# Patient Record
Sex: Male | Born: 1955
Health system: Southern US, Community
[De-identification: ages and names within clinical notes are randomized; demographics above are authoritative.]

## PROBLEM LIST (undated history)

## (undated) DIAGNOSIS — I509 Heart failure, unspecified: Secondary | ICD-10-CM

## (undated) DIAGNOSIS — I517 Cardiomegaly: Secondary | ICD-10-CM

## (undated) DIAGNOSIS — I10 Essential (primary) hypertension: Secondary | ICD-10-CM

## (undated) DIAGNOSIS — I5022 Chronic systolic (congestive) heart failure: Principal | ICD-10-CM

## (undated) DIAGNOSIS — N189 Chronic kidney disease, unspecified: Secondary | ICD-10-CM

## (undated) HISTORY — DX: Chronic systolic (congestive) heart failure: I50.22

## (undated) HISTORY — DX: Chronic kidney disease, unspecified: N18.9

## (undated) HISTORY — DX: Cardiomegaly: I51.7

## (undated) HISTORY — DX: Heart failure, unspecified: I50.9

## (undated) HISTORY — DX: Essential (primary) hypertension: I10

---

## 1999-09-16 HISTORY — PX: OTHER SURGICAL HISTORY: SHX169

## 1999-10-06 ENCOUNTER — Encounter: Payer: Self-pay | Admitting: Neurosurgery

## 1999-10-07 ENCOUNTER — Ambulatory Visit (HOSPITAL_COMMUNITY): Admission: RE | Admit: 1999-10-07 | Discharge: 1999-10-08 | Payer: Self-pay | Admitting: Neurosurgery

## 1999-10-07 ENCOUNTER — Encounter: Payer: Self-pay | Admitting: Neurosurgery

## 2017-12-14 DIAGNOSIS — I517 Cardiomegaly: Secondary | ICD-10-CM

## 2017-12-14 HISTORY — DX: Cardiomegaly: I51.7

## 2018-01-01 ENCOUNTER — Telehealth: Payer: Self-pay

## 2018-01-01 NOTE — Telephone Encounter (Signed)
Ok to take as new pt, lease schedule a OV at his convenience

## 2018-01-01 NOTE — Telephone Encounter (Signed)
Copied from CRM [id number]. Topic: General - Other >> Jan 01, 2018  4:35 PM Stephannie Li, NT wrote: Reason for CRM: Patient called to find out if Dr Drue Novel would take him as a patient , his cousin is a patient is and his name is Ileana Roup  please consider this request , please call him at [phone]

## 2018-01-02 NOTE — Telephone Encounter (Signed)
Patient has been scheduled for appointment on 01/11/18

## 2018-01-02 NOTE — Telephone Encounter (Signed)
Tried calling pt back but his number was not working. I will see if I can try his email.

## 2018-01-02 NOTE — Telephone Encounter (Signed)
Please call patient and schedule an appointment as new patient at your convenience.

## 2018-01-02 NOTE — Telephone Encounter (Signed)
Email address not listed.

## 2018-01-08 ENCOUNTER — Ambulatory Visit: Payer: Self-pay

## 2018-01-08 ENCOUNTER — Emergency Department (HOSPITAL_COMMUNITY)
Admission: EM | Admit: 2018-01-08 | Discharge: 2018-01-09 | Disposition: A | Discharge status: Home / Self Care | Payer: BLUE CROSS/BLUE SHIELD | Attending: Emergency Medicine | Admitting: Emergency Medicine

## 2018-01-08 ENCOUNTER — Other Ambulatory Visit: Payer: Self-pay

## 2018-01-08 ENCOUNTER — Emergency Department (HOSPITAL_COMMUNITY): Payer: BLUE CROSS/BLUE SHIELD

## 2018-01-08 ENCOUNTER — Encounter (HOSPITAL_COMMUNITY): Payer: Self-pay

## 2018-01-08 DIAGNOSIS — I159 Secondary hypertension, unspecified: Secondary | ICD-10-CM | POA: Diagnosis not present

## 2018-01-08 DIAGNOSIS — I509 Heart failure, unspecified: Secondary | ICD-10-CM | POA: Diagnosis not present

## 2018-01-08 DIAGNOSIS — R0600 Dyspnea, unspecified: Secondary | ICD-10-CM | POA: Diagnosis not present

## 2018-01-08 DIAGNOSIS — I110 Hypertensive heart disease with heart failure: Secondary | ICD-10-CM | POA: Diagnosis not present

## 2018-01-08 DIAGNOSIS — R0602 Shortness of breath: Secondary | ICD-10-CM | POA: Diagnosis not present

## 2018-01-08 DIAGNOSIS — N289 Disorder of kidney and ureter, unspecified: Secondary | ICD-10-CM | POA: Insufficient documentation

## 2018-01-08 LAB — HEPATIC FUNCTION PANEL
ALK PHOS: 106 U/L (ref 38–126)
ALT: 129 U/L — ABNORMAL HIGH (ref 17–63)
AST: 88 U/L — AB (ref 15–41)
Albumin: 3.7 g/dL (ref 3.5–5.0)
BILIRUBIN DIRECT: 0.4 mg/dL (ref 0.1–0.5)
BILIRUBIN TOTAL: 1.7 mg/dL — AB (ref 0.3–1.2)
Indirect Bilirubin: 1.3 mg/dL — ABNORMAL HIGH (ref 0.3–0.9)
Total Protein: 5.9 g/dL — ABNORMAL LOW (ref 6.5–8.1)

## 2018-01-08 LAB — CBC
HCT: 46.4 % (ref 39.0–52.0)
Hemoglobin: 15.4 g/dL (ref 13.0–17.0)
MCH: 30.1 pg (ref 26.0–34.0)
MCHC: 33.2 g/dL (ref 30.0–36.0)
MCV: 90.8 fL (ref 78.0–100.0)
Platelets: 288 10*3/uL (ref 150–400)
RBC: 5.11 MIL/uL (ref 4.22–5.81)
RDW: 13.3 % (ref 11.5–15.5)
WBC: 5.6 10*3/uL (ref 4.0–10.5)

## 2018-01-08 LAB — BASIC METABOLIC PANEL
Anion gap: 8 (ref 5–15)
BUN: 27 mg/dL — AB (ref 6–20)
CO2: 25 mmol/L (ref 22–32)
Calcium: 8.5 mg/dL — ABNORMAL LOW (ref 8.9–10.3)
Chloride: 109 mmol/L (ref 101–111)
Creatinine, Ser: 1.62 mg/dL — ABNORMAL HIGH (ref 0.61–1.24)
GFR calc Af Amer: 52 mL/min — ABNORMAL LOW (ref >60)
GFR, EST NON AFRICAN AMERICAN: 45 mL/min — AB (ref >60)
GLUCOSE: 111 mg/dL — AB (ref 65–99)
Potassium: 4.5 mmol/L (ref 3.5–5.1)
Sodium: 142 mmol/L (ref 135–145)

## 2018-01-08 LAB — I-STAT TROPONIN, ED: TROPONIN I, POC: 0.04 ng/mL (ref 0.00–0.08)

## 2018-01-08 LAB — BRAIN NATRIURETIC PEPTIDE: B Natriuretic Peptide: 809.9 pg/mL — ABNORMAL HIGH (ref 0.0–100.0)

## 2018-01-08 MED ORDER — FUROSEMIDE 40 MG PO TABS
40.0000 mg | ORAL_TABLET | Freq: Once | ORAL | Status: AC
  Administered 2018-01-09: 40 mg via ORAL
  Filled 2018-01-08: qty 1

## 2018-01-08 MED ORDER — AMLODIPINE BESYLATE 5 MG PO TABS: 5.0000 mg | ORAL_TABLET | Freq: Every day | ORAL | 2 refills | Status: DC

## 2018-01-08 MED ORDER — HYDROCHLOROTHIAZIDE 25 MG PO TABS: 25.0000 mg | ORAL_TABLET | Freq: Every day | ORAL | 2 refills | Status: DC

## 2018-01-08 MED ORDER — AMLODIPINE BESYLATE 5 MG PO TABS
10.0000 mg | ORAL_TABLET | Freq: Every day | ORAL | Status: DC
  Administered 2018-01-09: 10 mg via ORAL
  Filled 2018-01-08: qty 2

## 2018-01-08 NOTE — Telephone Encounter (Signed)
Was referred to the ED, he said he will go thus will see him 01/11/2009 as schedule

## 2018-01-08 NOTE — Telephone Encounter (Signed)
FYI. Pt has NP appt 01/11/2018.

## 2018-01-08 NOTE — ED Provider Notes (Signed)
[facility]-EMERGENCY DEPT Provider Note   CSN: [id number] Arrival date & time: 01/08/18  2052     History   Chief Complaint Chief Complaint  Patient presents with  . Shortness of Breath    HPI Brett Owen is a 62 y.o. male.  Patient complains of gradual onset of leg swelling, with weight gain, over the last 2 months, progressively worse, now associated with orthopnea and dyspnea on exertion.  He schedule appointment to see a new doctor in 3 days, but decided come today because he was getting worse.  He denies chest pain, nausea, vomiting, headache, back pain, weakness or dizziness.  No prior history of hypertension.  There are no other known modifying factors.  HPI  History reviewed. No pertinent past medical history.  There are no active problems to display for this patient.   Past Surgical History:  Procedure Laterality Date  . herniated disc surgery  09/1999   lumbar        Home Medications    Prior to Admission medications   Medication Sig Start Date End Date Taking? Authorizing Provider  Multiple Vitamin (MULTIVITAMIN WITH MINERALS) TABS tablet Take 1 tablet by mouth daily.   Yes [provider]  amLODipine (NORVASC) 5 MG tablet Take 1 tablet (5 mg total) by mouth daily. 01/08/18   Mancel Bale, MD  hydrochlorothiazide (HYDRODIURIL) 25 MG tablet Take 1 tablet (25 mg total) by mouth daily. 01/08/18   Mancel Bale, MD    Family History History reviewed. No pertinent family history.  Social History Social History   Tobacco Use  . Smoking status: Never Smoker  . Smokeless tobacco: Never Used  Substance Use Topics  . Alcohol use: Not Currently    Comment: seasonal, maybe at holidays  . Drug use: Never     Allergies   Patient has no known allergies.   Review of Systems Review of Systems  All other systems reviewed and are negative.    Physical Exam Updated Vital Signs BP (!) 157/118   Pulse (!) 113   Temp 98.4  F (36.9 C) (Oral)   Resp 17   Ht 6\' 2"  (1.88 m)   Wt 107.7 kg (237 lb 6.4 oz)   SpO2 98%   BMI 30.48 kg/m   Physical Exam  Constitutional: He is oriented to person, place, and time. He appears well-developed and well-nourished.  HENT:  Head: Normocephalic and atraumatic.  Right Ear: External ear normal.  Left Ear: External ear normal.  Eyes: Pupils are equal, round, and reactive to light. Conjunctivae and EOM are normal.  Neck: Normal range of motion and phonation normal. Neck supple. JVD present.  Cardiovascular: Regular rhythm and normal heart sounds.  Tachycardic and hypertensive  Pulmonary/Chest: Effort normal. He has decreased breath sounds in the right middle field, the right lower field, the left middle field and the left lower field. He has no wheezes. He has no rhonchi. He has no rales. He exhibits no bony tenderness.  Abdominal: Soft. There is no tenderness.  Musculoskeletal: Normal range of motion.  Neurological: He is alert and oriented to person, place, and time. No cranial nerve deficit or sensory deficit. He exhibits normal muscle tone. Coordination normal.  Skin: Skin is warm, dry and intact.  Psychiatric: He has a normal mood and affect. His behavior is normal. Judgment and thought content normal.  Nursing note and vitals reviewed.    ED Treatments / Results  Labs (all labs ordered are listed, but only abnormal  results are displayed) Labs Reviewed  BASIC METABOLIC PANEL - Abnormal; Notable for the following components:      Result Value   Glucose, Bld 111 (*)    BUN 27 (*)    Creatinine, Ser 1.62 (*)    Calcium 8.5 (*)    GFR calc non Af Amer 45 (*)    GFR calc Af Amer 52 (*)    All other components within normal limits  BRAIN NATRIURETIC PEPTIDE - Abnormal; Notable for the following components:   B Natriuretic Peptide 809.9 (*)    All other components within normal limits  HEPATIC FUNCTION PANEL - Abnormal; Notable for the following components:   Total  Protein 5.9 (*)    AST 88 (*)    ALT 129 (*)    Total Bilirubin 1.7 (*)    Indirect Bilirubin 1.3 (*)    All other components within normal limits  CBC  I-STAT TROPONIN, ED    EKG EKG Interpretation  Date/Time:  Tuesday January 08 2018 21:21:59 EDT Ventricular Rate:  118 PR Interval:    QRS Duration: 85 QT Interval:  410 QTC Calculation: 575 R Axis:   69 Text Interpretation:  Sinus tachycardia Probable left atrial enlargement Left ventricular hypertrophy Borderline T abnormalities, inferior leads Prolonged QT interval No old tracing to compare Confirmed by Mancel Bale [phone]) on 01/08/2018 9:32:07 PM   Radiology Dg Chest 2 View  Result Date: 01/08/2018 CLINICAL DATA:  Shortness of breath. Bilateral lower extremity swelling. EXAM: CHEST - 2 VIEW COMPARISON:  None. FINDINGS: The cardiac silhouette is mildly enlarged. There is elevation of the right hemidiaphragm. A small right pleural effusion is present. There may be a trace left pleural effusion. There is mild pulmonary vascular congestion without overt edema. No pneumothorax is identified. No acute osseous abnormality is seen. IMPRESSION: 1. Cardiomegaly and pulmonary vascular congestion. 2. Small right and possible trace left pleural effusions. Electronically Signed   By: Sebastian Ache M.D.   On: 01/08/2018 21:55    Procedures Procedures (including critical care time)  Medications Ordered in ED Medications  furosemide (LASIX) tablet 40 mg (has no administration in time range)  amLODipine (NORVASC) tablet 10 mg (has no administration in time range)     Initial Impression / Assessment and Plan / ED Course  I have reviewed the triage vital signs and the nursing notes.  Pertinent labs & imaging results that were available during my care of the patient were reviewed by me and considered in my medical decision making (see chart for details).  Clinical Course as of Jan 08 2357  Tue Jan 08, 2018  2155 Patient with shortness of  breath, JVD, tachycardia and hypertension, most likely has insidious onset of hypertension causing congestive heart failure.  Screening labs obtained and chest x-ray ordered   [EW]  2252 I-stat troponin, ED [EW]  2253 Normal  I-stat troponin, ED [EW]  2253 Mild transaminase elevation  Hepatic function panel(!) [EW]  2253 Elevated BUN, creatinine.  Low calcium and GFR.  Basic metabolic panel(!) [EW]  2253 Normal  CBC [EW]  2253 Stress test that was fluid overload, vascular congestion and small pleural effusions.  DG Chest 2 View [EW]  2254 Still hypertensive, but improved from admission.  BP(!): 154/108 [EW]  2254 Lasix ordered for fluid overload   [EW]    Clinical Course User Index [EW] Mancel Bale, MD     Patient Vitals for the past 24 hrs:  BP Temp Temp src Pulse Resp SpO2  Height Weight  01/08/18 2302 (!) 157/118 - - (!) 113 17 98 % - -  01/08/18 2228 (!) 154/108 - - (!) 110 17 97 % - -  01/08/18 2124 - - - - - 97 % - -  01/08/18 2109 (!) 160/120 98.4 F (36.9 C) Oral (!) 117 16 100 % 6\' 2"  (1.88 m) 107.7 kg (237 lb 6.4 oz)    11:49 PM Reevaluation with update and discussion. After initial assessment and treatment, an updated evaluation reveals he remains comfortable has no further complaints.  Findings discussed with patient all questions answered.  He is agreeable to discharge home and following up with his PCP coming 3 days. Mancel Bale      Final Clinical Impressions(s) / ED Diagnoses   Final diagnoses:  Secondary hypertension  Acute congestive heart failure, unspecified heart failure type [facility])  Renal insufficiency    ED Discharge Orders        Ordered    amLODipine (NORVASC) 5 MG tablet  Daily     01/08/18 2351    hydrochlorothiazide (HYDRODIURIL) 25 MG tablet  Daily     01/08/18 2351       Mancel Bale, MD 01/08/18 2358

## 2018-01-08 NOTE — ED Triage Notes (Addendum)
Pt c/o shortness of breath with exacerbation, bilateral swelling in ankles, weight gain that has been progressively worse over the past 6 weeks. Pt reports while lying flat, it feels like there is "fluid in lungs" Abdomen feels "full" and is distended. No previous cardiac problems. Called new PCP, was scheduled for an apt this Friday, but called today and the PCP encouraged pt to come to the ED. Denies chest pain, but does experience palpitations from "time to time." Also has a dry cough x1 week, worse after going from resting to exertion. Speaking in full sentences.

## 2018-01-08 NOTE — Telephone Encounter (Signed)
Pt. Reports has noticed wt. Gain and swelling to lower extremities x 2 weeks, but has gotten worse this week. Short of breath with exertion and laying down at night - has to prop up on pillows. Also has tightness in his abdomen "which is very uncomfortable". Denies any chest pain. No availability in office. Recommended that pt. Go to ED for evaluation today - FC agrees. Pt. Verbalizes understanding. Reason for Disposition . [1] Difficulty breathing with exertion (e.g., walking) AND [2] new onset or worsening  Answer Assessment - Initial Assessment Questions 1. ONSET: "When did the swelling start?" (e.g., minutes, hours, days)     2 weeks ago 2. LOCATION: "What part of the leg is swollen?"  "Are both legs swollen or just one leg?"     From feet to his thighs 3. SEVERITY: "How bad is the swelling?" (e.g., localized; mild, moderate, severe)  - Localized - small area of swelling localized to one leg  - MILD pedal edema - swelling limited to foot and ankle, pitting edema < 1/4 inch (6 mm) deep, rest and elevation eliminate most or all swelling  - MODERATE edema - swelling of lower leg to knee, pitting edema > 1/4 inch (6 mm) deep, rest and elevation only partially reduce swelling  - SEVERE edema - swelling extends above knee, facial or hand swelling present      Severe 4. REDNESS: "Does the swelling look red or infected?"     No 5. PAIN: "Is the swelling painful to touch?" If so, ask: "How painful is it?"   (Scale 1-10; mild, moderate or severe)     2 6. FEVER: "Do you have a fever?" If so, ask: "What is it, how was it measured, and when did it start?"      No 7. CAUSE: "What do you think is causing the leg swelling?"     Unsure 8. MEDICAL HISTORY: "Do you have a history of heart failure, kidney disease, liver failure, or cancer?"     No 9. RECURRENT SYMPTOM: "Have you had leg swelling before?" If so, ask: "When was the last time?" "What happened that time?"     Never 10. OTHER SYMPTOMS: "Do  you have any other symptoms?" (e.g., chest pain, difficulty breathing)       At night has to prop on pillows and with minima exertion 11. PREGNANCY: "Is there any chance you are pregnant?" "When was your last menstrual period?"       No  Protocols used: LEG SWELLING AND EDEMA-A-AH

## 2018-01-08 NOTE — Discharge Instructions (Addendum)
Your primary problem at this time appears to be high blood pressure.  We are treating it two ways to lower the blood pressure.  The high blood pressure has led to fluid overload and decreased kidney perfusion raising your creatinine.  Hopefully the medicines will help resolve these problems.  It is important to follow-up with your doctor on Friday as scheduled.  Ask him about getting a cardiac echo to further evaluate your heart and high blood pressure.  If your symptoms worsen please return here for a checkup.  Is important to avoid added salt in your diet to help improve your blood pressure.  Please review the attached information regarding the DASH diet

## 2018-01-09 ENCOUNTER — Encounter: Payer: Self-pay | Admitting: General Practice

## 2018-01-09 DIAGNOSIS — I10 Essential (primary) hypertension: Secondary | ICD-10-CM | POA: Insufficient documentation

## 2018-01-09 DIAGNOSIS — N182 Chronic kidney disease, stage 2 (mild): Secondary | ICD-10-CM | POA: Insufficient documentation

## 2018-01-09 DIAGNOSIS — I509 Heart failure, unspecified: Secondary | ICD-10-CM | POA: Insufficient documentation

## 2018-01-11 ENCOUNTER — Ambulatory Visit: Payer: BLUE CROSS/BLUE SHIELD | Admitting: Internal Medicine

## 2018-01-11 ENCOUNTER — Telehealth: Payer: Self-pay

## 2018-01-11 ENCOUNTER — Encounter: Payer: Self-pay | Admitting: Internal Medicine

## 2018-01-11 VITALS — BP 132/78 | HR 114 | Temp 97.8°F | Resp 14 | Ht 74.0 in | Wt 237.0 lb

## 2018-01-11 DIAGNOSIS — I509 Heart failure, unspecified: Secondary | ICD-10-CM

## 2018-01-11 DIAGNOSIS — I10 Essential (primary) hypertension: Secondary | ICD-10-CM

## 2018-01-11 LAB — COMPREHENSIVE METABOLIC PANEL
AG Ratio: 1.9 (calc) (ref 1.0–2.5)
ALKALINE PHOSPHATASE (APISO): 108 U/L (ref 40–115)
ALT: 88 U/L — AB (ref 9–46)
AST: 39 U/L — AB (ref 10–35)
Albumin: 3.9 g/dL (ref 3.6–5.1)
BUN/Creatinine Ratio: 15 (calc) (ref 6–22)
BUN: 25 mg/dL (ref 7–25)
CALCIUM: 9.2 mg/dL (ref 8.6–10.3)
CO2: 28 mmol/L (ref 20–32)
CREATININE: 1.63 mg/dL — AB (ref 0.70–1.25)
Chloride: 105 mmol/L (ref 98–110)
Globulin: 2.1 g/dL (calc) (ref 1.9–3.7)
Glucose, Bld: 75 mg/dL (ref 65–99)
Potassium: 4.9 mmol/L (ref 3.5–5.3)
Sodium: 141 mmol/L (ref 135–146)
Total Bilirubin: 1.3 mg/dL — ABNORMAL HIGH (ref 0.2–1.2)
Total Protein: 6 g/dL — ABNORMAL LOW (ref 6.1–8.1)

## 2018-01-11 LAB — TSH: TSH: 3.18 m[IU]/L (ref 0.40–4.50)

## 2018-01-11 MED ORDER — CARVEDILOL 3.125 MG PO TABS: 3.1250 mg | ORAL_TABLET | Freq: Two times a day (BID) | ORAL | 0 refills | Status: DC

## 2018-01-11 MED ORDER — FUROSEMIDE 20 MG PO TABS: 20.0000 mg | ORAL_TABLET | Freq: Every day | ORAL | 0 refills | Status: DC

## 2018-01-11 NOTE — Progress Notes (Signed)
Subjective:    Patient ID: Brett Owen, male    DOB: 11-Oct-1956, 62 y.o.   MRN: [id number]  DOS:  01/11/2018 Type of visit - description : New patient Interval history: He has a number of symptoms. Started mid January 2019 he noticed gradual weight gain. Started February 2019 he noted gradual increase in lower extremity edema. He also has classic orthopnea, when he lays down he developed cough and a feeling of choked. He also has abdominal pressure and is able to only eat small portions. + DOE, occasionally shortness of breath at rest.  With above symptoms, he was scheduled visit to this office as a new patient however I recommended ER suspecting CHF.  Patient went to the ER 01/08/2018: BP was 157 10/16/2016, 155/121. Labs: Creatinine 1.6, potassium 4.5, CBC normal, BNP 809.9 which is high. LFTs: Elevated.  Troponin negative. Chest x-ray show cardiomegaly and pulmonary vascular congestion, small right and possible left trace left pleural effusions.  At the ER he was prescribed HCTZ and amlodipine and was sent here.  Wt Readings from Last 3 Encounters:  01/11/18 237 lb (107.5 kg)  01/08/18 237 lb 6.4 oz (107.7 kg)    Review of Systems  Since he left the ER he is actually feeling better.  Has lost 1 or 2 pounds per his own scales. Orthopnea has decreased, abdominal pressure is also decreased.  Denies fever chills Denies taking any herbal medications. No nausea, vomiting, diarrhea No recent airplane trip or prolonged car trip. Occasional cough mostly with walking Denies chest pain occasional palpitations.  Past Medical History:  Diagnosis Date  . Cardiomegaly 12/2017   seen on chest x-ray  . CHF (congestive heart failure) (HCC)   . Hypertension   . Renal insufficiency     Past Surgical History:  Procedure Laterality Date  . herniated disc surgery  09/1999   lumbar    Social History   Socioeconomic History  . Marital status: Single    Spouse name: Not on file    . Number of children: 0  . Years of education: Not on file  . Highest education level: Not on file  Occupational History  . Occupation: Office manager, semi -retired   Engineer, production  . Financial resource strain: Not on file  . Food insecurity:    Worry: Not on file    Inability: Not on file  . Transportation needs:    Medical: Not on file    Non-medical: Not on file  Tobacco Use  . Smoking status: Never Smoker  . Smokeless tobacco: Never Used  Substance and Sexual Activity  . Alcohol use: Not Currently    Comment: seasonal, maybe at holidays  . Drug use: Never  . Sexual activity: Not on file  Lifestyle  . Physical activity:    Days per week: Not on file    Minutes per session: Not on file  . Stress: Not on file  Relationships  . Social connections:    Talks on phone: Not on file    Gets together: Not on file    Attends religious service: Not on file    Active member of club or organization: Not on file    Attends meetings of clubs or organizations: Not on file    Relationship status: Not on file  . Intimate partner violence:    Fear of current or ex partner: Not on file    Emotionally abused: Not on file    Physically abused: Not on file  Forced sexual activity: Not on file  Other Topics Concern  . Not on file  Social History Narrative   Lives by himself     Family History  Problem Relation Age of Onset  . Colon cancer Mother 60  . Alcohol abuse Father   . Hypertension Paternal Grandfather   . CAD Neg Hx   . Prostate cancer Neg Hx      Allergies as of 01/11/2018   No Known Allergies     Medication List        Accurate as of 01/11/18  2:39 PM. Always use your most recent med list.          amLODipine 5 MG tablet Commonly known as:  NORVASC Take 1 tablet (5 mg total) by mouth daily.   hydrochlorothiazide 25 MG tablet Commonly known as:  HYDRODIURIL Take 1 tablet (25 mg total) by mouth daily.   multivitamin with minerals Tabs tablet Take 1 tablet by  mouth daily.          Objective:   Physical Exam BP 132/78 (BP Location: Left Arm, Patient Position: Sitting, Cuff Size: Normal)   Pulse (!) 114   Temp 97.8 F (36.6 C) (Oral)   Resp 14   Ht 6\' 2"  (1.88 m)   Wt 237 lb (107.5 kg)   SpO2 97%   BMI 30.43 kg/m  General:   Well developed, well nourished . NAD.  Neck: No  thyromegaly.  Normal carotid pulses, JVD to jaw HEENT:  Normocephalic . Face symmetric, atraumatic Lungs:  CTA B Normal respiratory effort, no intercostal retractions, no accessory muscle use. Heart: RRR,  no murmur.  +/+++ pretibial edema bilaterally  He was comfortable when laying down at the table for EKG Abdomen:  Not distended, soft, non-tender. No rebound or rigidity.  No organomegaly to palpation or percussion Skin: Exposed areas without rash. Not pale. Not jaundice Neurologic:  alert & oriented X3.  Speech normal, gait appropriate for age and unassisted Strength symmetric and appropriate for age.  Psych: Cognition and judgment appear intact.  Cooperative with normal attention span and concentration.  Behavior appropriate. No anxious or depressed appearing.     Assessment & Plan:   Assessment HTN new dx 12-2017 CHF new dx  12-2017  Plan: CHF, hypertension. Patient was not aware of any illness or disease until his recent visit to the ER. He is having classic symptoms of CHF since mid January. His physical exam, x-rays and labs are consistent with that. He started HCTZ and amlodipine as Rx by the ER physician and is actually feeling better  EKG today: Sinus tachycardia, LVH, no acute. Case discussed with Dr. Ladona Ridgel, I recommended to switch to Lasix, start a low-dose dose of carvedilol and see cardiology next week.  He agreed with my recommendations. Also will start on low-dose aspirin and check a CMP. If he has severe electrolyte abnormalities will recommend ER during the weekend.  Patient aware. See AVS Pt was very surprised by the dx, he is  counseled  RTC here 1 month see cardiology next week    Today, I spent more than 45   min with the patient: >50% of the time counseling regards near diagnosis of CHF, he was surprised, I counseled him, explaining that he needs further evaluation by cardiology. Also: -reviewing the chart and labs ordered by other providers  -coordinating his care

## 2018-01-11 NOTE — Patient Instructions (Addendum)
GO TO THE LAB : Get the blood work     GO TO THE FRONT DESK Schedule your next appointment for a checkup in 4 weeks  Stop hydrochlorothiazide Start carvedilol Start Lasix Continue amlodipine Start aspirin 81 mg daily   ER if: Increasing symptoms, chest pain, significant weight gain.  Weigh yourself daily  You will see cardiology next week     Heart Failure Heart failure means your heart has trouble pumping blood. This makes it hard for your body to work well. Heart failure is usually a long-term (chronic) condition. You must take good care of yourself and follow your doctor's treatment plan. Follow these instructions at home:  Take your heart medicine as told by your doctor. ? Do not stop taking medicine unless your doctor tells you to. ? Do not skip any dose of medicine. ? Refill your medicines before they run out. ? Take other medicines only as told by your doctor or pharmacist.  Stay active if told by your doctor. The elderly and people with severe heart failure should talk with a doctor about physical activity.  Eat heart-healthy foods. Choose foods that are without trans fat and are low in saturated fat, cholesterol, and salt (sodium). This includes fresh or frozen fruits and vegetables, fish, lean meats, fat-free or low-fat dairy foods, whole grains, and high-fiber foods. Lentils and dried peas and beans (legumes) are also good choices.  Limit salt if told by your doctor.  Cook in a healthy way. Roast, grill, broil, bake, poach, steam, or stir-fry foods.  Limit fluids as told by your doctor.  Weigh yourself every morning. Do this after you pee (urinate) and before you eat breakfast. Write down your weight to give to your doctor.  Take your blood pressure and write it down if your doctor tells you to.  Ask your doctor how to check your pulse. Check your pulse as told.  Lose weight if told by your doctor.  Stop smoking or chewing tobacco. Do not use gum or  patches that help you quit without your doctor's approval.  Schedule and go to doctor visits as told.  Nonpregnant women should have no more than 1 drink a day. Men should have no more than 2 drinks a day. Talk to your doctor about drinking alcohol.  Stop illegal drug use.  Stay current with shots (immunizations).  Manage your health conditions as told by your doctor.  Learn to manage your stress.  Rest when you are tired.  If it is really hot outside: ? Avoid intense activities. ? Use air conditioning or fans, or get in a cooler place. ? Avoid caffeine and alcohol. ? Wear loose-fitting, lightweight, and light-colored clothing.  If it is really cold outside: ? Avoid intense activities. ? Layer your clothing. ? Wear mittens or gloves, a hat, and a scarf when going outside. ? Avoid alcohol.  Learn about heart failure and get support as needed.  Get help to maintain or improve your quality of life and your ability to care for yourself as needed. Contact a doctor if:  You gain weight quickly.  You are more short of breath than usual.  You cannot do your normal activities.  You tire easily.  You cough more than normal, especially with activity.  You have any or more puffiness (swelling) in areas such as your hands, feet, ankles, or belly (abdomen).  You cannot sleep because it is hard to breathe.  You feel like your heart is beating fast (palpitations).  You get dizzy or light-headed when you stand up. Get help right away if:  You have trouble breathing.  There is a change in mental status, such as becoming less alert or not being able to focus.  You have chest pain or discomfort.  You faint. This information is not intended to replace advice given to you by your health care provider. Make sure you discuss any questions you have with your health care provider. Document Released: 07/11/2008 Document Revised: 03/09/2016 Document Reviewed: 11/18/2012 Elsevier  Interactive Patient Education  2017 [employer].

## 2018-01-11 NOTE — Telephone Encounter (Signed)
DOD call received from Dr. Drue Novel with [facility] regarding new diagnosis of CHF per Dr. Drue Novel nurse. Per Dr. Lynn Ito to start carvedilol 3.125 mg po one tablet daily and furosemide 20 mg one tablet po daily. Pt needs ECHO and f/u with cards APP.   ECHO ordered.   Scheduling notified.

## 2018-01-11 NOTE — Progress Notes (Signed)
Pre visit review using our clinic review tool, if applicable. No additional management support is needed unless otherwise documented below in the visit note. 

## 2018-01-12 DIAGNOSIS — Z09 Encounter for follow-up examination after completed treatment for conditions other than malignant neoplasm: Secondary | ICD-10-CM | POA: Insufficient documentation

## 2018-01-12 NOTE — Assessment & Plan Note (Signed)
CHF, hypertension. Patient was not aware of any illness or disease until his recent visit to the ER. He is having classic symptoms of CHF since mid January. His physical exam, x-rays and labs are consistent with that. He started HCTZ and amlodipine as Rx by the ER physician and is actually feeling better  EKG today: Sinus tachycardia, LVH, no acute. Case discussed with Dr. Ladona Ridgel, I recommended to switch to Lasix, start a low-dose dose of carvedilol and see cardiology next week.  He agreed with my recommendations. Also will start on low-dose aspirin and check a CMP. If he has severe electrolyte abnormalities will recommend ER during the weekend.  Patient aware. See AVS Pt was very surprised by the dx, he is counseled  RTC here 1 month see cardiology next week

## 2018-01-14 ENCOUNTER — Encounter (HOSPITAL_COMMUNITY): Payer: Self-pay | Admitting: *Deleted

## 2018-01-14 ENCOUNTER — Other Ambulatory Visit: Payer: Self-pay

## 2018-01-14 ENCOUNTER — Ambulatory Visit (HOSPITAL_COMMUNITY): Payer: BLUE CROSS/BLUE SHIELD | Attending: Internal Medicine

## 2018-01-14 DIAGNOSIS — I509 Heart failure, unspecified: Secondary | ICD-10-CM | POA: Diagnosis not present

## 2018-01-14 DIAGNOSIS — I110 Hypertensive heart disease with heart failure: Secondary | ICD-10-CM | POA: Diagnosis not present

## 2018-01-14 DIAGNOSIS — I313 Pericardial effusion (noninflammatory): Secondary | ICD-10-CM | POA: Insufficient documentation

## 2018-01-14 DIAGNOSIS — I083 Combined rheumatic disorders of mitral, aortic and tricuspid valves: Secondary | ICD-10-CM | POA: Diagnosis not present

## 2018-01-14 NOTE — Progress Notes (Unsigned)
Brett Owen arrives complaining of shortness of breath and Edema for the last few months.  Has been seen in the ER and by Dr. Drue Novel recently and was diagnosed with HTN and Acute CHF.  Echocardiogram shows EF at 30% which seems generous visibly, with moderate to severe Regurgitation of Tricuspid, Mitral and Aortic Valves.  These results were taken to DOD Dr. [place] Emms, who releases the patient to his appointment with Dr. Elease Hashimoto at 8:40 am tomorrow morning.  Dr. Elease Hashimoto has also been made aware of the echo findings.    Farrel Conners, RDCS

## 2018-01-15 ENCOUNTER — Ambulatory Visit: Payer: BLUE CROSS/BLUE SHIELD | Admitting: Cardiovascular Disease

## 2018-01-15 ENCOUNTER — Encounter: Payer: Self-pay | Admitting: Cardiovascular Disease

## 2018-01-15 VITALS — BP 130/96 | HR 112 | Ht 74.0 in | Wt 237.8 lb

## 2018-01-15 DIAGNOSIS — I509 Heart failure, unspecified: Secondary | ICD-10-CM | POA: Diagnosis not present

## 2018-01-15 DIAGNOSIS — I5043 Acute on chronic combined systolic (congestive) and diastolic (congestive) heart failure: Secondary | ICD-10-CM

## 2018-01-15 DIAGNOSIS — I5022 Chronic systolic (congestive) heart failure: Secondary | ICD-10-CM | POA: Insufficient documentation

## 2018-01-15 HISTORY — DX: Chronic systolic (congestive) heart failure: I50.22

## 2018-01-15 MED ORDER — FUROSEMIDE 40 MG PO TABS: 40.0000 mg | ORAL_TABLET | Freq: Every day | ORAL | 3 refills | Status: DC

## 2018-01-15 MED ORDER — SACUBITRIL-VALSARTAN 24-26 MG PO TABS: 1.0000 | ORAL_TABLET | Freq: Two times a day (BID) | ORAL | 3 refills | Status: DC

## 2018-01-15 NOTE — Progress Notes (Signed)
Cardiology Office Note:    Date:  01/15/2018   ID:  Brett Owen, DOB 11/14/1955, MRN [id number]  PCP:  Wanda Plump, MD  Cardiologist:  Kristeen Miss, MD   Referring MD: Wanda Plump, MD   Problem list 1.  Acute on chronic combined systolic /  Diastolic  congestive heart failure: 2.  Essential hypertension 3.  Renal insufficiency  Chief Complaint  Patient presents with  . Congestive Heart Failure    History of Present Illness:    Brett Owen is a 62 y.o. male with a hx of hypertension and chronic kidney disease.  He is referred to see me for worsening shortness of breath.  He had an echocardiogram yesterday which showed severe left ventricular dysfunction with an ejection fraction of 25-30%.  He has mild aortic insufficiency, moderate mitral regurgitation.  There was a small pericardial effusion.  9 days ago, started having weight gain 1-2 lbs a week.   Developed swelling in ankles.  Progressed to calves and knees and eventually in the abd. Was not able to eat full meals Has developed shortness of breath,   HR has been 105 - 115  Has developed a dry cough,  Cough is worse with exertion + PND and orthopnea ,  Sleeps in a relciner propped up  15 lb weight gain .  Has not gone to the doctor regularly  Went to the ER and then Dr. Drue Novel  In the [place] long emergency room for HTN he was started on amlodipine 5 mg a day and HCTZ 25 mg a day.  He is he saw Dr. Drue Novel  several days later.  The HCTZ was discontinued and he was started on Lasix.  Carvedilol 3.125 mg twice a day was started.   The abdominal pressure has resolved slightly .  Has stopped gaining weight. Still has PND and orthopnea  No chest pain, no dizziness, no blurry vision  Tries to limit his salt intake. Has had some CKD  - Creatinine is 1.6   Mother had HTN, maternal grandfather had HTN,   Retired Building control surveyor , now works security  Does not exercise regularly .     Past Medical History:  Diagnosis Date  .  Cardiomegaly 12/2017   seen on chest x-ray  . CHF (congestive heart failure) (HCC)   . Hypertension   . Renal insufficiency     Past Surgical History:  Procedure Laterality Date  . herniated disc surgery  09/1999   lumbar    Current Medications: Current Meds  Medication Sig  . amLODipine (NORVASC) 5 MG tablet Take 1 tablet (5 mg total) by mouth daily.  [name] Kitchen aspirin EC 81 MG tablet Take 81 mg by mouth daily.  . carvedilol (COREG) 3.125 MG tablet Take 1 tablet (3.125 mg total) by mouth 2 (two) times daily with a meal.  . furosemide (LASIX) 20 MG tablet Take 1 tablet (20 mg total) by mouth daily.  . Multiple Vitamin (MULTIVITAMIN WITH MINERALS) TABS tablet Take 1 tablet by mouth daily.     Allergies:   Patient has no known allergies.   Social History   Socioeconomic History  . Marital status: Single    Spouse name: Not on file  . Number of children: 0  . Years of education: Not on file  . Highest education level: Not on file  Occupational History  . Occupation: Office manager, semi -retired   Engineer, production  . Financial resource strain: Not on file  . Food  insecurity:    Worry: Not on file    Inability: Not on file  . Transportation needs:    Medical: Not on file    Non-medical: Not on file  Tobacco Use  . Smoking status: Never Smoker  . Smokeless tobacco: Never Used  Substance and Sexual Activity  . Alcohol use: Not Currently    Comment: seasonal, maybe at holidays  . Drug use: Never  . Sexual activity: Not on file  Lifestyle  . Physical activity:    Days per week: Not on file    Minutes per session: Not on file  . Stress: Not on file  Relationships  . Social connections:    Talks on phone: Not on file    Gets together: Not on file    Attends religious service: Not on file    Active member of club or organization: Not on file    Attends meetings of clubs or organizations: Not on file    Relationship status: Not on file  Other Topics Concern  . Not on file  Social  History Narrative   Lives by himself     Family History: The patient's family history includes Alcohol abuse in his father; Colon cancer (age of onset: 55) in his mother; Hypertension in his paternal grandfather. There is no history of CAD or Prostate cancer.  ROS:   Please see the history of present illness.     All other systems reviewed and are negative.  EKGs/Labs/Other Studies Reviewed:    The following studies were reviewed today: Office note from Dr. Drue Novel and ER visit   EKG:  January 11, 2018  The ekg ordered today demonstrates  Sinus tach at 105.  NS ST abn.   Recent Labs: 01/08/2018: B Natriuretic Peptide 809.9; Hemoglobin 15.4; Platelets 288 01/11/2018: ALT 88; BUN 25; Creat 1.63; Potassium 4.9; Sodium 141; TSH 3.18  Recent Lipid Panel No results found for: CHOL, TRIG, HDL, CHOLHDL, VLDL, LDLCALC, LDLDIRECT  Physical Exam:    VS:  BP (!) 130/96   Pulse (!) 112   Ht 6\' 2"  (1.88 m)   Wt 237 lb 12.8 oz (107.9 kg)   SpO2 98%   BMI 30.53 kg/m     Wt Readings from Last 3 Encounters:  01/15/18 237 lb 12.8 oz (107.9 kg)  01/11/18 237 lb (107.5 kg)  01/08/18 237 lb 6.4 oz (107.7 kg)     GEN:  Well nourished, well developed in no acute distress HEENT: Normal NECK: Darkly elevated JVD.  No carotid bruits LYMPHATICS: No lymphadenopathy CARDIAC: Regular rate S1-S2.  Positive S3 gallop RESPIRATORY:  Clear to auscultation without rales, wheezing or rhonchi  ABDOMEN: Soft, non-tender, non-distended MUSCULOSKELETAL: Pitting edema all the way up to the abdomen. SKIN: Warm and dry NEUROLOGIC:  Alert and oriented x 3 PSYCHIATRIC:  Normal affect   ASSESSMENT:    No diagnosis found. PLAN:    In order of problems listed above:  1. Chronic combined systolic and diastolic congestive heart failure: Brett Owen presents with acute on chronic systolic and diastolic congestive heart failure. He does not go to the doctor regularly.  He apparently has had high blood pressure for quite  some time.  He is been started on Lasix 20 mg a day, carvedilol 3.125 mg twice a day, aspirin 81 mg a day and amlodipine 5 mill grams a day.  Is feeling a little bit better but still has significant volume.   We will discontinue the Norvasc.  We will start him  on Entresto 24-26 mg twice a day.  Increase Lasix to 40 mg a day.  I will see him in 2 days at 2 PM. Check a basic metabolic profile at that time. Anticipate  seeing him again in 2 weeks after that.  Had a long discussion about reduction of salt intake. At some point will need to rule out ischemia.  Is not had any episodes of chest discomfort.  We will work through this after we have 2 new moderate bit.  He is not able to lie flat enough for heart catheterization at this point.  2.  Hypertension: Continue up titration of medications.  Medication Adjustments/Labs and Tests Ordered: Current medicines are reviewed at length with the patient today.  Concerns regarding medicines are outlined above.  No orders of the defined types were placed in this encounter.  No orders of the defined types were placed in this encounter.   Signed, Kristeen Miss, MD  01/15/2018 8:53 AM    [facility]

## 2018-01-15 NOTE — Patient Instructions (Signed)
Medication Instructions:  1) DISCONTINUE Amlodipine 2) INCREASE Furosemide to 40mg  once daily 3) START Entresto 24/26mg - one tablet twice daily  Labwork: BMET when you return on 4/4  Your physician recommends that you return for lab work in: 2 weeks (BMET)   Testing/Procedures: None  Follow-Up: Your physician recommends that you schedule a follow-up appointment in: 4-4 at 2pm with Dr. Elease Hashimoto (ok per Dr. Elease Hashimoto)    Any Other Special Instructions Will Be Listed Below (If Applicable).     If you need a refill on your cardiac medications before your next appointment, please call your pharmacy.

## 2018-01-16 ENCOUNTER — Telehealth: Payer: Self-pay

## 2018-01-16 NOTE — Telephone Encounter (Signed)
I have done an [place] PA through covermymeds. Key: [id number]

## 2018-01-17 ENCOUNTER — Encounter: Payer: Self-pay | Admitting: Cardiovascular Disease

## 2018-01-17 ENCOUNTER — Ambulatory Visit: Payer: BLUE CROSS/BLUE SHIELD | Admitting: Cardiovascular Disease

## 2018-01-17 VITALS — BP 112/84 | HR 102 | Ht 74.0 in | Wt 224.8 lb

## 2018-01-17 DIAGNOSIS — I5023 Acute on chronic systolic (congestive) heart failure: Secondary | ICD-10-CM

## 2018-01-17 MED ORDER — POTASSIUM CHLORIDE CRYS ER 20 MEQ PO TBCR: 20.0000 meq | EXTENDED_RELEASE_TABLET | Freq: Every day | ORAL | 3 refills | Status: DC

## 2018-01-17 MED ORDER — CARVEDILOL 6.25 MG PO TABS: 25.0000 mg | ORAL_TABLET | Freq: Two times a day (BID) | ORAL | 3 refills | Status: DC

## 2018-01-17 MED ORDER — TORSEMIDE 20 MG PO TABS: 20.0000 mg | ORAL_TABLET | Freq: Two times a day (BID) | ORAL | 3 refills | Status: DC

## 2018-01-17 NOTE — Progress Notes (Signed)
Cardiology Office Note:    Date:  01/17/2018   ID:  Brett Owen, DOB Dec 27, 1955, MRN [id number]  PCP:  Wanda Plump, MD  Cardiologist:  Kristeen Miss, MD   Referring MD: Wanda Plump, MD   Problem list 1.  Acute on chronic combined systolic /  Diastolic  congestive heart failure: 2.  Essential hypertension 3.  Renal insufficiency  Chief Complaint  Patient presents with  . Congestive Heart Failure    History of Present Illness:    Brett Owen is a 62 y.o. male with a hx of hypertension and chronic kidney disease.  He is referred to see me for worsening shortness of breath.  He had an echocardiogram yesterday which showed severe left ventricular dysfunction with an ejection fraction of 25-30%.  He has mild aortic insufficiency, moderate mitral regurgitation.  There was a small pericardial effusion.  9 days ago, started having weight gain 1-2 lbs a week.   Developed swelling in ankles.  Progressed to calves and knees and eventually in the abd. Was not able to eat full meals Has developed shortness of breath,   HR has been 105 - 115  Has developed a dry cough,  Cough is worse with exertion + PND and orthopnea ,  Sleeps in a relciner propped up  15 lb weight gain .  Has not gone to the doctor regularly  Went to the ER and then Dr. Drue Novel  In the [place] long emergency room for HTN he was started on amlodipine 5 mg a day and HCTZ 25 mg a day.  He is he saw Dr. Drue Novel  several days later.  The HCTZ was discontinued and he was started on Lasix.  Carvedilol 3.125 mg twice a day was started.   The abdominal pressure has resolved slightly .  Has stopped gaining weight. Still has PND and orthopnea  No chest pain, no dizziness, no blurry vision  Tries to limit his salt intake. Has had some CKD  - Creatinine is 1.6   Mother had HTN, maternal grandfather had HTN,   Retired Building control surveyor , now works security  Does not exercise regularly .   January 17, 2018:  Wt is 224 today  - was 237 2  days ago - down 13 lbs   Had a very delayed reaction to Lasix -   Took 7 hours for diuressi  - then he diuresed for 12 hours   Feeling better   Past Medical History:  Diagnosis Date  . Cardiomegaly 12/2017   seen on chest x-ray  . CHF (congestive heart failure) (HCC)   . Hypertension   . Renal insufficiency     Past Surgical History:  Procedure Laterality Date  . herniated disc surgery  09/1999   lumbar    Current Medications: Current Meds  Medication Sig  . aspirin EC 81 MG tablet Take 81 mg by mouth daily.  . Multiple Vitamin (MULTIVITAMIN WITH MINERALS) TABS tablet Take 1 tablet by mouth daily.  . sacubitril-valsartan (ENTRESTO) 24-26 MG Take 1 tablet by mouth 2 (two) times daily.  . [DISCONTINUED] carvedilol (COREG) 3.125 MG tablet Take 1 tablet (3.125 mg total) by mouth 2 (two) times daily with a meal.  . [DISCONTINUED] furosemide (LASIX) 40 MG tablet Take 1 tablet (40 mg total) by mouth daily.     Allergies:   Patient has no known allergies.   Social History   Socioeconomic History  . Marital status: Single    Spouse name:  Not on file  . Number of children: 0  . Years of education: Not on file  . Highest education level: Not on file  Occupational History  . Occupation: Office manager, semi -retired   Engineer, production  . Financial resource strain: Not on file  . Food insecurity:    Worry: Not on file    Inability: Not on file  . Transportation needs:    Medical: Not on file    Non-medical: Not on file  Tobacco Use  . Smoking status: Never Smoker  . Smokeless tobacco: Never Used  Substance and Sexual Activity  . Alcohol use: Not Currently    Comment: seasonal, maybe at holidays  . Drug use: Never  . Sexual activity: Not on file  Lifestyle  . Physical activity:    Days per week: Not on file    Minutes per session: Not on file  . Stress: Not on file  Relationships  . Social connections:    Talks on phone: Not on file    Gets together: Not on file     Attends religious service: Not on file    Active member of club or organization: Not on file    Attends meetings of clubs or organizations: Not on file    Relationship status: Not on file  Other Topics Concern  . Not on file  Social History Narrative   Lives by himself     Family History: The patient's family history includes Alcohol abuse in his father; Colon cancer (age of onset: 75) in his mother; Hypertension in his paternal grandfather. There is no history of CAD or Prostate cancer.  ROS:   Please see the history of present illness.     All other systems reviewed and are negative.  EKGs/Labs/Other Studies Reviewed:    The following studies were reviewed today: Office note from Dr. Drue Novel and ER visit   EKG:  January 11, 2018  The ekg ordered today demonstrates  Sinus tach at 105.  NS ST abn.   Recent Labs: 01/08/2018: B Natriuretic Peptide 809.9; Hemoglobin 15.4; Platelets 288 01/11/2018: ALT 88; BUN 25; Creat 1.63; Potassium 4.9; Sodium 141; TSH 3.18  Recent Lipid Panel No results found for: CHOL, TRIG, HDL, CHOLHDL, VLDL, LDLCALC, LDLDIRECT  Physical Exam: Blood pressure 112/84, pulse (!) 102, height 6\' 2"  (1.88 m), weight 224 lb 12.8 oz (102 kg), SpO2 98 %.  GEN:  Well nourished, well developed in no acute distress HEENT: Normal NECK: No JVD; No carotid bruits LYMPHATICS: No lymphadenopathy CARDIAC: RR , tachycardia,  Gallop is not prominent  RESPIRATORY:  Clear to auscultation without rales, wheezing or rhonchi  ABDOMEN: Soft, non-tender, non-distended MUSCULOSKELETAL:   2+ edema of lower legs.   Edema of thighs has resolved.  SKIN: Warm and dry NEUROLOGIC:  Alert and oriented x 3   ASSESSMENT:    1. Acute on chronic systolic heart failure (HCC)    PLAN:    In order of problems listed above:  1.  Chronic combined systolic and diastolic congestive heart failure: [name]  Returns for follow-up of his acute on chronic combined systolic and diastolic congestive heart  failure.  He is diuresed 13 pounds since I saw him 2 days ago.  He is having some cramping.  We will add potassium chloride 20 mEq a day.  He will take 40 mEq today only.   The furosemide had a very delayed diuretic effect.  We will switch him to some at that which might be absorbed  better.  I suspect that he has lots of gut edema. HR is still fast.    Will increase Coreg to 6. 25 BID He will call if he has dizziness.  May need to hold the Torsemide if he get dizzy.   2.  Hypertension:  Controlled.    Medication Adjustments/Labs and Tests Ordered: Current medicines are reviewed at length with the patient today.  Concerns regarding medicines are outlined above.  Orders Placed This Encounter  Procedures  . Basic Metabolic Panel (BMET)   Meds ordered this encounter  Medications  . torsemide (DEMADEX) 20 MG tablet    Sig: Take 1 tablet (20 mg total) by mouth 2 (two) times daily.    Dispense:  90 tablet    Refill:  3  . potassium chloride SA (K-DUR,KLOR-CON) 20 MEQ tablet    Sig: Take 1 tablet (20 mEq total) by mouth daily.    Dispense:  90 tablet    Refill:  3  . carvedilol (COREG) 6.25 MG tablet    Sig: Take 4 tablets (25 mg total) by mouth 2 (two) times daily.    Dispense:  180 tablet    Refill:  3    Signed, Kristeen Miss, MD  01/17/2018 5:58 PM    [facility]

## 2018-01-17 NOTE — Patient Instructions (Addendum)
Medication Instructions:  Your physician has recommended you make the following change in your medication:   STOP Lasix (Furosemide)  START Torsemide (Demadex) 20 mg once daily START Kdur (Potassium chloride) 20 mEq once daily INCREASE Carvedilol (Coreg) to 6.25 mg twice daily   Labwork: TODAY - basic metabolic panel   Testing/Procedures: None Ordered   Follow-Up: Your physician recommends that you schedule a follow-up appointment on Tuesday April 16 at 7:40 am   If you need a refill on your cardiac medications before your next appointment, please call your pharmacy.   Thank you for choosing [facility]! Eligha Bridegroom, RN [phone]

## 2018-01-18 LAB — BASIC METABOLIC PANEL
BUN/Creatinine Ratio: 12 (ref 10–24)
BUN: 21 mg/dL (ref 8–27)
CALCIUM: 9 mg/dL (ref 8.6–10.2)
CHLORIDE: 103 mmol/L (ref 96–106)
CO2: 27 mmol/L (ref 20–29)
CREATININE: 1.69 mg/dL — AB (ref 0.76–1.27)
GFR calc Af Amer: 50 mL/min/{1.73_m2} — ABNORMAL LOW (ref >59)
GFR calc non Af Amer: 43 mL/min/{1.73_m2} — ABNORMAL LOW (ref >59)
GLUCOSE: 75 mg/dL (ref 65–99)
Potassium: 4.5 mmol/L (ref 3.5–5.2)
Sodium: 144 mmol/L (ref 134–144)

## 2018-01-23 ENCOUNTER — Encounter: Payer: Self-pay | Admitting: Cardiovascular Disease

## 2018-01-29 ENCOUNTER — Ambulatory Visit: Payer: BLUE CROSS/BLUE SHIELD | Admitting: Cardiovascular Disease

## 2018-01-29 ENCOUNTER — Other Ambulatory Visit: Payer: BLUE CROSS/BLUE SHIELD

## 2018-01-29 ENCOUNTER — Encounter: Payer: Self-pay | Admitting: Cardiovascular Disease

## 2018-01-29 VITALS — BP 98/66 | HR 97 | Ht 74.0 in | Wt 214.2 lb

## 2018-01-29 DIAGNOSIS — I10 Essential (primary) hypertension: Secondary | ICD-10-CM

## 2018-01-29 DIAGNOSIS — I5023 Acute on chronic systolic (congestive) heart failure: Secondary | ICD-10-CM | POA: Diagnosis not present

## 2018-01-29 LAB — BASIC METABOLIC PANEL
BUN/Creatinine Ratio: 16 (ref 10–24)
BUN: 25 mg/dL (ref 8–27)
CO2: 29 mmol/L (ref 20–29)
CREATININE: 1.52 mg/dL — AB (ref 0.76–1.27)
Calcium: 9.4 mg/dL (ref 8.6–10.2)
Chloride: 95 mmol/L — ABNORMAL LOW (ref 96–106)
GFR calc non Af Amer: 49 mL/min/{1.73_m2} — ABNORMAL LOW (ref >59)
GFR, EST AFRICAN AMERICAN: 57 mL/min/{1.73_m2} — AB (ref >59)
Glucose: 93 mg/dL (ref 65–99)
Potassium: 4 mmol/L (ref 3.5–5.2)
Sodium: 139 mmol/L (ref 134–144)

## 2018-01-29 MED ORDER — CARVEDILOL 6.25 MG PO TABS: 6.2500 mg | ORAL_TABLET | Freq: Two times a day (BID) | ORAL | 3 refills | Status: DC

## 2018-01-29 MED ORDER — TORSEMIDE 20 MG PO TABS: 20.0000 mg | ORAL_TABLET | Freq: Every day | ORAL | 3 refills | Status: DC

## 2018-01-29 MED ORDER — POTASSIUM CHLORIDE ER 10 MEQ PO TBCR: 10.0000 meq | EXTENDED_RELEASE_TABLET | Freq: Every day | ORAL | 3 refills | Status: DC

## 2018-01-29 NOTE — Progress Notes (Signed)
Cardiology Office Note:    Date:  01/29/2018   ID:  Brett Owen, DOB January 17, 1956, MRN [id number]  PCP:  Wanda Plump, MD  Cardiologist:  Kristeen Miss, MD   Referring MD: Wanda Plump, MD   Problem list 1.  Acute on chronic combined systolic /  Diastolic  congestive heart failure: 2.  Essential hypertension 3.  Renal insufficiency  Chief Complaint  Patient presents with  . Congestive Heart Failure    January 15, 2018    Brett Owen is a 62 y.o. male with a hx of hypertension and chronic kidney disease.  He is referred to see me for worsening shortness of breath.  He had an echocardiogram yesterday which showed severe left ventricular dysfunction with an ejection fraction of 25-30%.  He has mild aortic insufficiency, moderate mitral regurgitation.  There was a small pericardial effusion.  9 days ago, started having weight gain 1-2 lbs a week.   Developed swelling in ankles.  Progressed to calves and knees and eventually in the abd. Was not able to eat full meals Has developed shortness of breath,   HR has been 105 - 115  Has developed a dry cough,  Cough is worse with exertion + PND and orthopnea ,  Sleeps in a relciner propped up  15 lb weight gain .  Has not gone to the doctor regularly  Went to the ER and then Dr. Drue Novel  In the [place] long emergency room for HTN he was started on amlodipine 5 mg a day and HCTZ 25 mg a day.  He is he saw Dr. Drue Novel  several days later.  The HCTZ was discontinued and he was started on Lasix.  Carvedilol 3.125 mg twice a day was started.   The abdominal pressure has resolved slightly .  Has stopped gaining weight. Still has PND and orthopnea  No chest pain, no dizziness, no blurry vision  Tries to limit his salt intake. Has had some CKD  - Creatinine is 1.6   Mother had HTN, maternal grandfather had HTN,   Retired Building control surveyor , now works security  Does not exercise regularly .   April 16, 2 019: Brett Owen seen today for follow-up of his  acute on chronic combined systolic and diastolic congestive heart failure. He has had some episodes of orthostatic hypotension. Breathing is much better, no PND or orthopnea  Ankle edema is much better  HR is much better.  He was tachycardic - now is well controlled    Past Medical History:  Diagnosis Date  . Cardiomegaly 12/2017   seen on chest x-ray  . CHF (congestive heart failure) (HCC)   . Hypertension   . Renal insufficiency     Past Surgical History:  Procedure Laterality Date  . herniated disc surgery  09/1999   lumbar    Current Medications: Current Meds  Medication Sig  . aspirin EC 81 MG tablet Take 81 mg by mouth daily.  . Multiple Vitamin (MULTIVITAMIN WITH MINERALS) TABS tablet Take 1 tablet by mouth daily.  . sacubitril-valsartan (ENTRESTO) 24-26 MG Take 1 tablet by mouth 2 (two) times daily.  . [DISCONTINUED] carvedilol (COREG) 6.25 MG tablet Take 4 tablets (25 mg total) by mouth 2 (two) times daily.  . [DISCONTINUED] potassium chloride SA (K-DUR,KLOR-CON) 20 MEQ tablet Take 1 tablet (20 mEq total) by mouth daily.  . [DISCONTINUED] torsemide (DEMADEX) 20 MG tablet Take 1 tablet (20 mg total) by mouth 2 (two) times daily.  Allergies:   Patient has no known allergies.   Social History   Socioeconomic History  . Marital status: Single    Spouse name: Not on file  . Number of children: 0  . Years of education: Not on file  . Highest education level: Not on file  Occupational History  . Occupation: Office manager, semi -retired   Engineer, production  . Financial resource strain: Not on file  . Food insecurity:    Worry: Not on file    Inability: Not on file  . Transportation needs:    Medical: Not on file    Non-medical: Not on file  Tobacco Use  . Smoking status: Never Smoker  . Smokeless tobacco: Never Used  Substance and Sexual Activity  . Alcohol use: Not Currently    Comment: seasonal, maybe at holidays  . Drug use: Never  . Sexual activity: Not on  file  Lifestyle  . Physical activity:    Days per week: Not on file    Minutes per session: Not on file  . Stress: Not on file  Relationships  . Social connections:    Talks on phone: Not on file    Gets together: Not on file    Attends religious service: Not on file    Active member of club or organization: Not on file    Attends meetings of clubs or organizations: Not on file    Relationship status: Not on file  Other Topics Concern  . Not on file  Social History Narrative   Lives by himself     Family History: The patient's family history includes Alcohol abuse in his father; Colon cancer (age of onset: 56) in his mother; Hypertension in his paternal grandfather. There is no history of CAD or Prostate cancer.  ROS:   He has had some episodes of orthostatic hypotension..  EKGs/Labs/Other Studies Reviewed:    The following studies were reviewed today: Office note from Dr. Drue Novel and ER visit   EKG:   Recent Labs: 01/08/2018: B Natriuretic Peptide 809.9; Hemoglobin 15.4; Platelets 288 01/11/2018: ALT 88; TSH 3.18 01/17/2018: BUN 21; Creatinine, Ser 1.69; Potassium 4.5; Sodium 144  Recent Lipid Panel No results found for: CHOL, TRIG, HDL, CHOLHDL, VLDL, LDLCALC, LDLDIRECT  Physical Exam: Blood pressure 98/66, pulse 97, height 6\' 2"  (1.88 m), weight 214 lb 3.2 oz (97.2 kg), SpO2 98 %.  GEN:  Well nourished, well developed in no acute distress HEENT: Normal NECK: No JVD; No carotid bruits LYMPHATICS: No lymphadenopathy CARDIAC: RRR , soft systolic murmur  RESPIRATORY:  Clear to auscultation without rales, wheezing or rhonchi  ABDOMEN: Soft, non-tender, non-distended MUSCULOSKELETAL:  No edema; No deformity  SKIN: Warm and dry NEUROLOGIC:  Alert and oriented x 3   ASSESSMENT:/ PLAN:    In order of problems listed above:  1. Chronic combined systolic and diastolic congestive heart failure: Sanay presents with acute on chronic systolic and diastolic congestive heart  failure. He is feeling quite a bit better.  He is having some episodes of orthostatic hypotension.  We will reduce the dose of torsemide to 20 mg once a day.  Decrease potassium chloride to 10 mg a day.  He will remain on carvedilol 6.25 mg twice a day which is the dose he is actually been taking.  We still need to rule out ischemic heart disease.  He is doing well and has not had any episodes of angina.  I would like to stabilize him a bit more before we  consider doing either a stress  Myoview study .  He would like to walk on the treadmill instead of doing Lexiscan.   2.  Hypertension:    Blood pressure is actually low.  He is paying more attention to his salt intak  Medication Adjustments/Labs and Tests Ordered:  Current medicines are reviewed at length with the patient today.  Concerns regarding medicines are outlined above.  Orders Placed This Encounter  Procedures  . Basic Metabolic Panel (BMET)   Meds ordered this encounter  Medications  . carvedilol (COREG) 6.25 MG tablet    Sig: Take 1 tablet (6.25 mg total) by mouth 2 (two) times daily.    Dispense:  180 tablet    Refill:  3  . torsemide (DEMADEX) 20 MG tablet    Sig: Take 1 tablet (20 mg total) by mouth daily.    Dispense:  90 tablet    Refill:  3  . potassium chloride (K-DUR) 10 MEQ tablet    Sig: Take 1 tablet (10 mEq total) by mouth daily.    Dispense:  90 tablet    Refill:  3    Signed, Kristeen Miss, MD  01/29/2018 8:41 AM    [facility]

## 2018-01-29 NOTE — Patient Instructions (Signed)
Medication Instructions:  Your physician has recommended you make the following change in your medication:  TAKE Carvedilol 6.25 mg twice daily DECREASE Torsemide (Demadex) to 20 mg daily DECREASE Kdur (potassium chloride) to 10 mEq daily    Labwork: TODAY - basic metabolic panel   Testing/Procedures: None Ordered   Follow-Up: Your physician recommends that you schedule a follow-up appointment in: 6 weeks with Dr. Elease Hashimoto   If you need a refill on your cardiac medications before your next appointment, please call your pharmacy.   Thank you for choosing [facility]! Eligha Bridegroom, RN [phone]

## 2018-01-30 NOTE — Telephone Encounter (Signed)
**Note De-Identified [name] Obfuscation** Message received through covermymeds: Message from Plan Effective from 01/16/2018 through 10/15/2038.

## 2018-02-08 ENCOUNTER — Ambulatory Visit: Payer: BLUE CROSS/BLUE SHIELD | Admitting: Internal Medicine

## 2018-02-12 ENCOUNTER — Ambulatory Visit: Payer: BLUE CROSS/BLUE SHIELD | Admitting: Internal Medicine

## 2018-02-12 ENCOUNTER — Encounter: Payer: Self-pay | Admitting: Internal Medicine

## 2018-02-12 VITALS — BP 124/68 | HR 69 | Temp 97.9°F | Resp 14 | Ht 74.0 in | Wt 211.2 lb

## 2018-02-12 DIAGNOSIS — Z114 Encounter for screening for human immunodeficiency virus [HIV]: Secondary | ICD-10-CM

## 2018-02-12 DIAGNOSIS — I5022 Chronic systolic (congestive) heart failure: Secondary | ICD-10-CM | POA: Diagnosis not present

## 2018-02-12 DIAGNOSIS — R7989 Other specified abnormal findings of blood chemistry: Secondary | ICD-10-CM

## 2018-02-12 DIAGNOSIS — R945 Abnormal results of liver function studies: Secondary | ICD-10-CM

## 2018-02-12 DIAGNOSIS — Z1159 Encounter for screening for other viral diseases: Secondary | ICD-10-CM

## 2018-02-12 NOTE — Progress Notes (Signed)
Pre visit review using our clinic review tool, if applicable. No additional management support is needed unless otherwise documented below in the visit note. 

## 2018-02-12 NOTE — Patient Instructions (Signed)
  GO TO THE FRONT DESK Schedule your next appointment for a checkup in 6 months  Schedule labs to be done in the next few days, fasting

## 2018-02-12 NOTE — Progress Notes (Signed)
Subjective:    Patient ID: Brett Owen, male    DOB: 11-21-1955, 62 y.o.   MRN: [id number]  DOS:  02/12/2018 Type of visit - description : f/u Interval history: Since the last visit, has seen cardiology several times, feeling much improved.  Chart reviewed.  Wt Readings from Last 3 Encounters:  02/12/18 211 lb 4 oz (95.8 kg)  01/29/18 214 lb 3.2 oz (97.2 kg)  01/17/18 224 lb 12.8 oz (102 kg)     Review of Systems Denies chest pain, difficulty breathing, cough or lower extremity edema For the last few months have noted the third right finger distally gets "solid white" for few minutes, symptom associated with numbness and self resolved.  Has been associated with grabbing cold beverage, no hyperemia afterwards.  Past Medical History:  Diagnosis Date  . Cardiomegaly 12/2017   seen on chest x-ray  . CHF (congestive heart failure) (HCC)   . Hypertension   . Renal insufficiency     Past Surgical History:  Procedure Laterality Date  . herniated disc surgery  09/1999   lumbar    Social History   Socioeconomic History  . Marital status: Single    Spouse name: Not on file  . Number of children: 0  . Years of education: Not on file  . Highest education level: Not on file  Occupational History  . Occupation: Office manager, semi -retired   Engineer, production  . Financial resource strain: Not on file  . Food insecurity:    Worry: Not on file    Inability: Not on file  . Transportation needs:    Medical: Not on file    Non-medical: Not on file  Tobacco Use  . Smoking status: Never Smoker  . Smokeless tobacco: Never Used  Substance and Sexual Activity  . Alcohol use: Not Currently    Comment: seasonal, maybe at holidays  . Drug use: Never  . Sexual activity: Not on file  Lifestyle  . Physical activity:    Days per week: Not on file    Minutes per session: Not on file  . Stress: Not on file  Relationships  . Social connections:    Talks on phone: Not on file    Gets  together: Not on file    Attends religious service: Not on file    Active member of club or organization: Not on file    Attends meetings of clubs or organizations: Not on file    Relationship status: Not on file  . Intimate partner violence:    Fear of current or ex partner: Not on file    Emotionally abused: Not on file    Physically abused: Not on file    Forced sexual activity: Not on file  Other Topics Concern  . Not on file  Social History Narrative   Lives by himself      Allergies as of 02/12/2018   No Known Allergies     Medication List        Accurate as of 02/12/18 11:59 PM. Always use your most recent med list.          aspirin EC 81 MG tablet Take 81 mg by mouth daily.   carvedilol 6.25 MG tablet Commonly known as:  COREG Take 1 tablet (6.25 mg total) by mouth 2 (two) times daily.   multivitamin with minerals Tabs tablet Take 1 tablet by mouth daily.   potassium chloride 10 MEQ tablet Commonly known as:  K-DUR Take 1  tablet (10 mEq total) by mouth daily.   sacubitril-valsartan 24-26 MG Commonly known as:  ENTRESTO Take 1 tablet by mouth 2 (two) times daily.   torsemide 20 MG tablet Commonly known as:  DEMADEX Take 1 tablet (20 mg total) by mouth daily.          Objective:   Physical Exam BP 124/68 (BP Location: Left Arm, Patient Position: Sitting, Cuff Size: Small)   Pulse 69   Temp 97.9 F (36.6 C) (Oral)   Resp 14   Ht 6\' 2"  (1.88 m)   Wt 211 lb 4 oz (95.8 kg)   SpO2 97%   BMI 27.12 kg/m  General:   Well developed, well nourished . NAD.  HEENT:  Normocephalic . Face symmetric, atraumatic Lungs:  CTA B Normal respiratory effort, no intercostal retractions, no accessory muscle use. Heart: RRR,  no murmur.  No pretibial edema bilaterally  Skin: Hands normal to inspection and palpation Neurologic:  alert & oriented X3.  Speech normal, gait appropriate for age and unassisted Psych--  Cognition and judgment appear intact.    Cooperative with normal attention span and concentration.  Behavior appropriate. No anxious or depressed appearing.      Assessment & Plan:   Assessment HTN new dx 12-2017 CHF new dx  12-2017  Plan:  HTN: Currently well controlled on carvedilol, Entresto, Demadex and potassium.  Last BMP satisfactory. CHF: Since the last visit, saw cardiology, echo showed EF of 25%, cardiology planning to do stress test . Overall, patient feels great, he was very appreciative of his heart care. Raynaud phenomena: Sxs as described above c/w this dx.  This was discussed with the patient.  Encouraged him to also discuss with cardiology. Avoid cold exposure  Increased LFTs: Check labs Preventive care: Declining a tetanus shot, check FLP, hep C and HIV.  Will come back fasting RTC 6 months

## 2018-02-13 NOTE — Assessment & Plan Note (Signed)
HTN: Currently well controlled on carvedilol, Entresto, Demadex and potassium.  Last BMP satisfactory. CHF: Since the last visit, saw cardiology, echo showed EF of 25%, cardiology planning to do stress test . Overall, patient feels great, he was very appreciative of his heart care. Raynaud phenomena: Sxs as described above c/w this dx.  This was discussed with the patient.  Encouraged him to also discuss with cardiology. Avoid cold exposure  Increased LFTs: Check labs Preventive care: Declining a tetanus shot, check FLP, hep C and HIV.  Will come back fasting RTC 6 months

## 2018-02-15 ENCOUNTER — Other Ambulatory Visit (INDEPENDENT_AMBULATORY_CARE_PROVIDER_SITE_OTHER): Payer: BLUE CROSS/BLUE SHIELD

## 2018-02-15 DIAGNOSIS — I5022 Chronic systolic (congestive) heart failure: Secondary | ICD-10-CM | POA: Diagnosis not present

## 2018-02-15 DIAGNOSIS — Z1159 Encounter for screening for other viral diseases: Secondary | ICD-10-CM

## 2018-02-15 DIAGNOSIS — R7989 Other specified abnormal findings of blood chemistry: Secondary | ICD-10-CM

## 2018-02-15 DIAGNOSIS — R945 Abnormal results of liver function studies: Secondary | ICD-10-CM | POA: Diagnosis not present

## 2018-02-15 DIAGNOSIS — Z114 Encounter for screening for human immunodeficiency virus [HIV]: Secondary | ICD-10-CM | POA: Diagnosis not present

## 2018-02-15 LAB — HEPATIC FUNCTION PANEL
ALBUMIN: 4.3 g/dL (ref 3.5–5.2)
ALK PHOS: 85 U/L (ref 39–117)
ALT: 29 U/L (ref 0–53)
AST: 22 U/L (ref 0–37)
Bilirubin, Direct: 0.2 mg/dL (ref 0.0–0.3)
TOTAL PROTEIN: 7.1 g/dL (ref 6.0–8.3)
Total Bilirubin: 1.1 mg/dL (ref 0.2–1.2)

## 2018-02-15 LAB — LIPID PANEL
CHOLESTEROL: 209 mg/dL — AB (ref 0–200)
HDL: 36.6 mg/dL — AB (ref >39.00)
LDL Cholesterol: 143 mg/dL — ABNORMAL HIGH (ref 0–99)
NONHDL: 172.75
Total CHOL/HDL Ratio: 6
Triglycerides: 148 mg/dL (ref 0.0–149.0)
VLDL: 29.6 mg/dL (ref 0.0–40.0)

## 2018-02-18 LAB — HIV ANTIBODY (ROUTINE TESTING W REFLEX): HIV: NONREACTIVE

## 2018-02-18 LAB — HEPATITIS C ANTIBODY
Hepatitis C Ab: NONREACTIVE
SIGNAL TO CUT-OFF: 0.06 (ref <1.00)

## 2018-02-21 ENCOUNTER — Telehealth: Payer: Self-pay | Admitting: Internal Medicine

## 2018-02-21 NOTE — Telephone Encounter (Signed)
Copied from CRM [id number]. Topic: Quick Communication - See Telephone Encounter >> Feb 21, 2018 10:23 AM Arlyss Gandy, NT wrote: CRM for notification. See Telephone encounter for: 02/21/18. Pt returning call to get lab results.

## 2018-03-04 ENCOUNTER — Ambulatory Visit (INDEPENDENT_AMBULATORY_CARE_PROVIDER_SITE_OTHER): Payer: BLUE CROSS/BLUE SHIELD | Admitting: Cardiovascular Disease

## 2018-03-04 ENCOUNTER — Encounter: Payer: Self-pay | Admitting: Cardiovascular Disease

## 2018-03-04 ENCOUNTER — Encounter: Payer: Self-pay | Admitting: Nurse Practitioner

## 2018-03-04 VITALS — BP 106/64 | HR 83 | Ht 74.0 in | Wt 212.6 lb

## 2018-03-04 DIAGNOSIS — I5043 Acute on chronic combined systolic (congestive) and diastolic (congestive) heart failure: Secondary | ICD-10-CM

## 2018-03-04 LAB — BASIC METABOLIC PANEL
BUN/Creatinine Ratio: 13 (ref 10–24)
BUN: 19 mg/dL (ref 8–27)
CALCIUM: 9.4 mg/dL (ref 8.6–10.2)
CO2: 27 mmol/L (ref 20–29)
CREATININE: 1.46 mg/dL — AB (ref 0.76–1.27)
Chloride: 99 mmol/L (ref 96–106)
GFR calc Af Amer: 60 mL/min/{1.73_m2} (ref >59)
GFR, EST NON AFRICAN AMERICAN: 52 mL/min/{1.73_m2} — AB (ref >59)
Glucose: 61 mg/dL — ABNORMAL LOW (ref 65–99)
POTASSIUM: 4.2 mmol/L (ref 3.5–5.2)
Sodium: 141 mmol/L (ref 134–144)

## 2018-03-04 NOTE — Progress Notes (Signed)
Cardiology Office Note:    Date:  03/04/2018   ID:  Brett Owen, DOB [birth year], MRN [id number]  PCP:  Wanda Plump, MD  Cardiologist:  Kristeen Miss, MD   Referring MD: Wanda Plump, MD   Problem list 1.  Acute on chronic combined systolic /  Diastolic  congestive heart failure: 2.  Essential hypertension 3.  Renal insufficiency  Chief Complaint  Patient presents with  . Congestive Heart Failure    January 15, 2018    Brett Owen is a 62 y.o. male with a hx of hypertension and chronic kidney disease.  He is referred to see me for worsening shortness of breath.  He had an echocardiogram yesterday which showed severe left ventricular dysfunction with an ejection fraction of 25-30%.  He has mild aortic insufficiency, moderate mitral regurgitation.  There was a small pericardial effusion.  9 days ago, started having weight gain 1-2 lbs a week.   Developed swelling in ankles.  Progressed to calves and knees and eventually in the abd. Was not able to eat full meals Has developed shortness of breath,   HR has been 105 - 115  Has developed a dry cough,  Cough is worse with exertion + PND and orthopnea ,  Sleeps in a relciner propped up  15 lb weight gain .  Has not gone to the doctor regularly  Went to the ER and then Dr. Drue Novel  In the [place] [facility] emergency room for HTN he was started on amlodipine 5 mg a day and HCTZ 25 mg a day.  He is he saw Dr. Drue Novel  several days later.  The HCTZ was discontinued and he was started on Lasix.  Carvedilol 3.125 mg twice a day was started.   The abdominal pressure has resolved slightly .  Has stopped gaining weight. Still has PND and orthopnea  No chest pain, no dizziness, no blurry vision  Tries to limit his salt intake. Has had some CKD  - Creatinine is 1.6   Mother had HTN, maternal grandfather had HTN,   Retired Building control surveyor , now works security  Does not exercise regularly .   April 16, 2 019: [name] seen today for follow-up of his  acute on chronic combined systolic and diastolic congestive heart failure. He has had some episodes of orthostatic hypotension. Breathing is much better, no PND or orthopnea  Ankle edema is much better  HR is much better.  He was tachycardic - now is well controlled   Mar 04, 2018:     [name] is seen back today for follow-up of his congestive heart failure. Is doing well.   Is walking twice a week.   Is mowing his grass  Past Medical History:  Diagnosis Date  . Cardiomegaly 12/2017   seen on chest x-ray  . CHF (congestive heart failure) (HCC)   . Hypertension   . Renal insufficiency     Past Surgical History:  Procedure Laterality Date  . herniated disc surgery  09/1999   lumbar    Current Medications: Current Meds  Medication Sig  . aspirin EC 81 MG tablet Take 81 mg by mouth daily.  . carvedilol (COREG) 6.25 MG tablet Take 1 tablet (6.25 mg total) by mouth 2 (two) times daily.  . Multiple Vitamin (MULTIVITAMIN WITH MINERALS) TABS tablet Take 1 tablet by mouth daily.  . potassium chloride (K-DUR) 10 MEQ tablet Take 1 tablet (10 mEq total) by mouth daily.  . sacubitril-valsartan (ENTRESTO) 24-26  MG Take 1 tablet by mouth 2 (two) times daily.  [name] Kitchen torsemide (DEMADEX) 20 MG tablet Take 1 tablet (20 mg total) by mouth daily.     Allergies:   Patient has no known allergies.   Social History   Socioeconomic History  . Marital status: Single    Spouse name: Not on file  . Number of children: 0  . Years of education: Not on file  . Highest education level: Not on file  Occupational History  . Occupation: Office manager, semi -retired   Engineer, production  . Financial resource strain: Not on file  . Food insecurity:    Worry: Not on file    Inability: Not on file  . Transportation needs:    Medical: Not on file    Non-medical: Not on file  Tobacco Use  . Smoking status: Never Smoker  . Smokeless tobacco: Never Used  Substance and Sexual Activity  . Alcohol use: Not Currently     Comment: seasonal, maybe at holidays  . Drug use: Never  . Sexual activity: Not on file  Lifestyle  . Physical activity:    Days per week: Not on file    Minutes per session: Not on file  . Stress: Not on file  Relationships  . Social connections:    Talks on phone: Not on file    Gets together: Not on file    Attends religious service: Not on file    Active member of club or organization: Not on file    Attends meetings of clubs or organizations: Not on file    Relationship status: Not on file  Other Topics Concern  . Not on file  Social History Narrative   Lives by himself     Family History: The patient's family history includes Alcohol abuse in his father; Colon cancer (age of onset: 56) in his mother; Hypertension in his paternal grandfather. There is no history of CAD or Prostate cancer.  ROS:   He has had some episodes of orthostatic hypotension..  EKGs/Labs/Other Studies Reviewed:    The following studies were reviewed today: Office note from Dr. Drue Novel and ER visit   EKG:   Recent Labs: 01/08/2018: B Natriuretic Peptide 809.9; Hemoglobin 15.4; Platelets 288 01/11/2018: TSH 3.18 01/29/2018: BUN 25; Creatinine, Ser 1.52; Potassium 4.0; Sodium 139 02/15/2018: ALT 29  Recent Lipid Panel    Component Value Date/Time   CHOL 209 (H) 02/15/2018 1009   TRIG 148.0 02/15/2018 1009   HDL 36.60 (L) 02/15/2018 1009   CHOLHDL 6 02/15/2018 1009   VLDL 29.6 02/15/2018 1009   LDLCALC 143 (H) 02/15/2018 1009    Physical Exam: Blood pressure 106/64, pulse 83, height 6\' 2"  (1.88 m), weight 212 lb 9.6 oz (96.4 kg), SpO2 98 %.  GEN:  Well nourished, well developed in no acute distress HEENT: Normal NECK: No JVD; No carotid bruits LYMPHATICS: No lymphadenopathy CARDIAC: RR, no murmurs, rubs, gallops RESPIRATORY:  Clear to auscultation without rales, wheezing or rhonchi  ABDOMEN: Soft, non-tender, non-distended MUSCULOSKELETAL:  No edema; No deformity  SKIN: Warm and  dry NEUROLOGIC:  Alert and oriented x 3    ASSESSMENT:/ PLAN:    In order of problems listed above:  1. Chronic combined systolic and diastolic congestive heart failure: Susie presents with acute on chronic systolic and diastolic congestive heart failure. He is feeling quite a bit better.  He is having some episodes of orthostatic hypotension.  We will reduce the dose of torsemide to 20 mg  once a day.  Decrease potassium chloride to 10 mg a day.  He will remain on carvedilol 6.25 mg twice a day which is the dose he is actually been taking.  We still need to rule out ischemic heart disease.  He is doing well and has not had any episodes of angina.  I would like to stabilize him a bit more before we consider doing either a stress  Myoview study .  He would like to walk on the treadmill instead of doing Lexiscan.   2.  Hypertension:    Blood pressure is actually low.  He is paying more attention to his salt intak  Medication Adjustments/Labs and Tests Ordered:  Current medicines are reviewed at length with the patient today.  Concerns regarding medicines are outlined above.  No orders of the defined types were placed in this encounter.  No orders of the defined types were placed in this encounter.   Signed, Kristeen Miss, MD  03/04/2018 8:57 AM    [facility]

## 2018-03-04 NOTE — Patient Instructions (Signed)
Medication Instructions:  Your physician recommends that you continue on your current medications as directed. Please refer to the Current Medication list given to you today.   Labwork: TODAY - basic metabolic panel   Testing/Procedures: Your physician has requested that you have an exercise stress myoview. For further information please visit [email or web address] Please follow instruction sheet, as given.   Follow-Up: Your physician recommends that you schedule a follow-up appointment in: 3 months with Dr. Elease Hashimoto   If you need a refill on your cardiac medications before your next appointment, please call your pharmacy.   Thank you for choosing [facility]! Eligha Bridegroom, RN [phone]

## 2018-03-14 ENCOUNTER — Telehealth (HOSPITAL_COMMUNITY): Payer: Self-pay | Admitting: *Deleted

## 2018-03-14 NOTE — Telephone Encounter (Signed)
Patient given detailed instructions per Myocardial Perfusion Study Information Sheet for the test on 03/19/18. Patient notified to arrive 15 minutes early and that it is imperative to arrive on time for appointment to keep from having the test rescheduled.  If you need to cancel or reschedule your appointment, please call the office within 24 hours of your appointment. . Patient verbalized understanding. Brett Owen     

## 2018-03-19 ENCOUNTER — Ambulatory Visit (HOSPITAL_COMMUNITY): Payer: BLUE CROSS/BLUE SHIELD | Attending: Cardiology

## 2018-03-19 DIAGNOSIS — I5043 Acute on chronic combined systolic (congestive) and diastolic (congestive) heart failure: Secondary | ICD-10-CM | POA: Insufficient documentation

## 2018-03-19 DIAGNOSIS — R9439 Abnormal result of other cardiovascular function study: Secondary | ICD-10-CM | POA: Diagnosis not present

## 2018-03-19 MED ORDER — TECHNETIUM TC 99M TETROFOSMIN IV KIT
10.3000 | PACK | Freq: Once | INTRAVENOUS | Status: AC | PRN
  Administered 2018-03-19: 10.3 via INTRAVENOUS
  Filled 2018-03-19: qty 11

## 2018-03-19 MED ORDER — TECHNETIUM TC 99M TETROFOSMIN IV KIT
31.4000 | PACK | Freq: Once | INTRAVENOUS | Status: AC | PRN
  Administered 2018-03-19: 31.4 via INTRAVENOUS
  Filled 2018-03-19: qty 32

## 2018-03-20 LAB — MYOCARDIAL PERFUSION IMAGING
CHL CUP MPHR: 160 {beats}/min
CHL CUP NUCLEAR SRS: 3
CHL CUP NUCLEAR SSS: 4
CSEPEW: 9.3 METS
CSEPPHR: 150 {beats}/min
Exercise duration (min): 7 min
Exercise duration (sec): 30 s
LHR: 0.28
LV dias vol: 187 mL (ref 62–150)
LV sys vol: 134 mL
NUC STRESS TID: 0.93
Percent HR: 93 %
Rest HR: 77 {beats}/min
SDS: 1

## 2018-06-26 ENCOUNTER — Encounter: Payer: Self-pay | Admitting: Cardiovascular Disease

## 2018-06-26 ENCOUNTER — Ambulatory Visit: Payer: BLUE CROSS/BLUE SHIELD | Admitting: Cardiovascular Disease

## 2018-06-26 VITALS — BP 128/70 | HR 87 | Ht 74.0 in | Wt 211.8 lb

## 2018-06-26 DIAGNOSIS — I5043 Acute on chronic combined systolic (congestive) and diastolic (congestive) heart failure: Secondary | ICD-10-CM | POA: Diagnosis not present

## 2018-06-26 MED ORDER — SACUBITRIL-VALSARTAN 49-51 MG PO TABS: 1.0000 | ORAL_TABLET | Freq: Two times a day (BID) | ORAL | 11 refills | Status: DC

## 2018-06-26 NOTE — Progress Notes (Signed)
Cardiology Office Note:    Date:  06/26/2018   ID:  Brett Owen, DOB Aug 21, 1956, MRN [id number]  PCP:  Wanda Plump, MD  Cardiologist:  Kristeen Miss, MD   Referring MD: Wanda Plump, MD   Problem list 1.  Acute on chronic combined systolic /  Diastolic  congestive heart failure: 2.  Essential hypertension 3.  Renal insufficiency  Chief Complaint  Patient presents with  . Congestive Heart Failure    January 15, 2018    Brett Owen is a 62 y.o. male with a hx of hypertension and chronic kidney disease.  He is referred to see me for worsening shortness of breath.  He had an echocardiogram yesterday which showed severe left ventricular dysfunction with an ejection fraction of 25-30%.  He has mild aortic insufficiency, moderate mitral regurgitation.  There was a small pericardial effusion.  9 days ago, started having weight gain 1-2 lbs a week.   Developed swelling in ankles.  Progressed to calves and knees and eventually in the abd. Was not able to eat full meals Has developed shortness of breath,   HR has been 105 - 115  Has developed a dry cough,  Cough is worse with exertion + PND and orthopnea ,  Sleeps in a relciner propped up  15 lb weight gain .  Has not gone to the doctor regularly  Went to the ER and then Dr. Drue Novel  In the [place] long emergency room for HTN he was started on amlodipine 5 mg a day and HCTZ 25 mg a day.  He is he saw Dr. Drue Novel  several days later.  The HCTZ was discontinued and he was started on Lasix.  Carvedilol 3.125 mg twice a day was started.   The abdominal pressure has resolved slightly .  Has stopped gaining weight. Still has PND and orthopnea  No chest pain, no dizziness, no blurry vision  Tries to limit his salt intake. Has had some CKD  - Creatinine is 1.6   Mother had HTN, maternal grandfather had HTN,   Retired Building control surveyor , now works security  Does not exercise regularly .   April 16, 2 019: Brett Owen seen today for follow-up of his  acute on chronic combined systolic and diastolic congestive heart failure. He has had some episodes of orthostatic hypotension. Breathing is much better, no PND or orthopnea  Ankle edema is much better  HR is much better.  He was tachycardic - now is well controlled   Mar 04, 2018:     Brett Owen is seen back today for follow-up of his congestive heart failure. Is doing well.   Is walking twice a week.   Is mowing his grass  June 26, 2018: Brett Owen seen today for follow-up of his congestive heart failure. Doing well   Past Medical History:  Diagnosis Date  . Cardiomegaly 12/2017   seen on chest x-ray  . CHF (congestive heart failure) (HCC)   . Hypertension   . Renal insufficiency     Past Surgical History:  Procedure Laterality Date  . herniated disc surgery  09/1999   lumbar    Current Medications: Current Meds  Medication Sig  . aspirin EC 81 MG tablet Take 81 mg by mouth daily.  . carvedilol (COREG) 6.25 MG tablet Take 1 tablet (6.25 mg total) by mouth 2 (two) times daily.  . Multiple Vitamin (MULTIVITAMIN WITH MINERALS) TABS tablet Take 1 tablet by mouth daily.  . potassium chloride (K-DUR)  10 MEQ tablet Take 1 tablet (10 mEq total) by mouth daily.  . sacubitril-valsartan (ENTRESTO) 24-26 MG Take 1 tablet by mouth 2 (two) times daily.  [name] Kitchen torsemide (DEMADEX) 20 MG tablet Take 1 tablet (20 mg total) by mouth daily.     Allergies:   Patient has no known allergies.   Social History   Socioeconomic History  . Marital status: Single    Spouse name: Not on file  . Number of children: 0  . Years of education: Not on file  . Highest education level: Not on file  Occupational History  . Occupation: Office manager, semi -retired   Engineer, production  . Financial resource strain: Not on file  . Food insecurity:    Worry: Not on file    Inability: Not on file  . Transportation needs:    Medical: Not on file    Non-medical: Not on file  Tobacco Use  . Smoking status: Never Smoker    . Smokeless tobacco: Never Used  Substance and Sexual Activity  . Alcohol use: Not Currently    Comment: seasonal, maybe at holidays  . Drug use: Never  . Sexual activity: Not on file  Lifestyle  . Physical activity:    Days per week: Not on file    Minutes per session: Not on file  . Stress: Not on file  Relationships  . Social connections:    Talks on phone: Not on file    Gets together: Not on file    Attends religious service: Not on file    Active member of club or organization: Not on file    Attends meetings of clubs or organizations: Not on file    Relationship status: Not on file  Other Topics Concern  . Not on file  Social History Narrative   Lives by himself     Family History: The patient's family history includes Alcohol abuse in his father; Colon cancer (age of onset: 97) in his mother; Hypertension in his paternal grandfather. There is no history of CAD or Prostate cancer.  ROS:   He has had some episodes of orthostatic hypotension..  EKGs/Labs/Other Studies Reviewed:      EKG:   Recent Labs: 01/08/2018: B Natriuretic Peptide 809.9; Hemoglobin 15.4; Platelets 288 01/11/2018: TSH 3.18 02/15/2018: ALT 29 03/04/2018: BUN 19; Creatinine, Ser 1.46; Potassium 4.2; Sodium 141  Recent Lipid Panel    Component Value Date/Time   CHOL 209 (H) 02/15/2018 1009   TRIG 148.0 02/15/2018 1009   HDL 36.60 (L) 02/15/2018 1009   CHOLHDL 6 02/15/2018 1009   VLDL 29.6 02/15/2018 1009   LDLCALC 143 (H) 02/15/2018 1009    Physical Exam: Blood pressure 128/70, pulse 87, height 6\' 2"  (1.88 m), weight 211 lb 12.8 oz (96.1 kg), SpO2 98 %.  GEN:  Well nourished, well developed in no acute distress HEENT: Normal NECK: No JVD; No carotid bruits LYMPHATICS: No lymphadenopathy CARDIAC: RRR , no murmurs, rubs, gallops RESPIRATORY:  Clear to auscultation without rales, wheezing or rhonchi  ABDOMEN: Soft, non-tender, non-distended MUSCULOSKELETAL:  No edema; No deformity  SKIN:  Warm and dry NEUROLOGIC:  Alert and oriented x 3    ASSESSMENT:/ PLAN:    In order of problems listed above:  Acute on Chronic combined systolic and diastolic congestive heart failure: We will increase the Entresto up to 49 mg - 51 mg twice a day.  He seems to be tolerating the current dose well.  Anticipate adding Aldactone or increase in carvedilol  at his next visit. We will see him again in 6 weeks for follow-up evaluation.  We will check a basic metabolic profile in 3 weeks.  2.  Hypertension:     Medication Adjustments/Labs and Tests Ordered:  Current medicines are reviewed at length with the patient today.  Concerns regarding medicines are outlined above.  No orders of the defined types were placed in this encounter.  No orders of the defined types were placed in this encounter.   Signed, Kristeen Miss, MD  06/26/2018 9:35 AM    [facility]

## 2018-06-26 NOTE — Patient Instructions (Signed)
Medication Instructions:  Your physician has recommended you make the following change in your medication:   INCREASE Entresto to 49-51 mg twice daily   Labwork: Your physician recommends that you return for lab work in: 3 weeks for basic metabolic panel   Testing/Procedures: None Ordered   Follow-Up: Your physician recommends that you schedule a follow-up appointment in: 6 weeks with Dr. Elease Hashimoto or APP on his team   If you need a refill on your cardiac medications before your next appointment, please call your pharmacy.   Thank you for choosing [facility]! Eligha Bridegroom, RN [phone]

## 2018-07-17 ENCOUNTER — Other Ambulatory Visit: Payer: BLUE CROSS/BLUE SHIELD | Admitting: *Deleted

## 2018-07-17 DIAGNOSIS — I5043 Acute on chronic combined systolic (congestive) and diastolic (congestive) heart failure: Secondary | ICD-10-CM

## 2018-07-17 LAB — BASIC METABOLIC PANEL
BUN/Creatinine Ratio: 14 (ref 10–24)
BUN: 20 mg/dL (ref 8–27)
CO2: 28 mmol/L (ref 20–29)
Calcium: 9.3 mg/dL (ref 8.6–10.2)
Chloride: 99 mmol/L (ref 96–106)
Creatinine, Ser: 1.43 mg/dL — ABNORMAL HIGH (ref 0.76–1.27)
GFR calc non Af Amer: 53 mL/min/{1.73_m2} — ABNORMAL LOW (ref >59)
GFR, EST AFRICAN AMERICAN: 61 mL/min/{1.73_m2} (ref >59)
Glucose: 99 mg/dL (ref 65–99)
Potassium: 3.8 mmol/L (ref 3.5–5.2)
Sodium: 141 mmol/L (ref 134–144)

## 2018-08-06 NOTE — Progress Notes (Signed)
Cardiology Office Note   Date:  08/07/2018   ID:  Brett, Owen [birth year], MRN [id number]  PCP:  Wanda Plump, MD  Cardiologist:  Dr. Leodis Sias, MD    Chief Complaint  Patient presents with  . Cardiomyopathy  . Hypertension  . Congestive Heart Failure   History of Present Illness: Brett Owen is a 62 y.o. male who presents for non-ischemic cardiomyopathy follow up, seen for Dr. Elease Hashimoto.   Brett Owen has a hx of HTN, CKD Stage II and non-ischemic cardiomyopathy who was initially referred to Dr. Elease Hashimoto 01/2018 for progressive SOB. He underwent an echocardiogram which showed severely decreased LV function with an EF of 25-30% with mild aortic insufficiency, moderate mitral regurgitation and small pericardial effusion. Other symptoms included weight gain, LW swelling, cough and orthopnea. He was found to be markedly hypertensive and was started on amlodipine, Lasix and carvedilol. After this, his symptoms were more manageable, including SOB and LE edema improvement. He was slowly increasing his physical activities including mowing his yard and walking twice a week. Follow up Lexiscan stress test  03/19/18 revelaed no ischemia, however marked LV dysfunction. He was last seen by Dr. Elease Hashimoto on 06/26/18 and as doing well with no complaints. His Entresto was increased to 49-51mg  twice daily and was tolerating it well. Anticipating adding [name] as BP allows.   Today he is doing well without complaints of chest pain, palpitaitons, SOB, dyspnea, LE swelling or orthopnea symptoms. He continues to be activity in his daily life. He reports initial mild dizziness upon waking in the AM with the start of Entresto, however this has now resolved.  Will plan for medication titration.   Past Medical History:  Diagnosis Date  . Cardiomegaly 12/2017   seen on chest x-ray  . CHF (congestive heart failure) (HCC)   . Hypertension   . Renal insufficiency     Past Surgical History:  Procedure  Laterality Date  . herniated disc surgery  09/1999   lumbar    Current Outpatient Medications  Medication Sig Dispense Refill  . aspirin EC 81 MG tablet Take 81 mg by mouth daily.    . carvedilol (COREG) 6.25 MG tablet Take 1 tablet (6.25 mg total) by mouth 2 (two) times daily. 180 tablet 3  . Multiple Vitamin (MULTIVITAMIN WITH MINERALS) TABS tablet Take 1 tablet by mouth daily.    . potassium chloride (K-DUR) 10 MEQ tablet Take 1 tablet (10 mEq total) by mouth daily. 90 tablet 3  . sacubitril-valsartan (ENTRESTO) 49-51 MG Take 1 tablet by mouth 2 (two) times daily. 60 tablet 11  . torsemide (DEMADEX) 20 MG tablet Take 1 tablet (20 mg total) by mouth daily. 90 tablet 3  . spironolactone (ALDACTONE) 25 MG tablet Take 0.5 tablets (12.5 mg total) by mouth daily. 90 tablet 3   No current facility-administered medications for this visit.     Allergies:   Patient has no known allergies.   Social History:  The patient  reports that he has never smoked. He has never used smokeless tobacco. He reports that he drank alcohol. He reports that he does not use drugs.   Family History:  The patient's family history includes Alcohol abuse in his father; Colon cancer (age of onset: 51) in his mother; Hypertension in his paternal grandfather.    ROS:  Please see the history of present illness.  Otherwise, review of systems are positive for none.   All other systems are reviewed and  negative.    PHYSICAL EXAM: VS:  BP 124/70   Pulse 86   Ht 6\' 2"  (1.88 m)   Wt 212 lb 6.4 oz (96.3 kg)   SpO2 96%   BMI 27.27 kg/m  , BMI Body mass index is 27.27 kg/m.  General: Well developed, well nourished, NAD Skin: Warm, dry, intact  Head: Normocephalic, atraumatic, clear, moist mucus membranes. Neck: Negative for carotid bruits. No JVD Lungs:Clear to ausculation bilaterally. No wheezes, rales, or rhonchi. Breathing is unlabored. Cardiovascular: RRR with S1 S2. No murmurs, rubs, gallops, or LV heave  appreciated. Abdomen: Soft, non-tender, non-distended with normoactive bowel sounds. No hepatomegaly, No rebound/guarding. No obvious abdominal masses. MSK: Strength and tone appear normal for age. 5/5 in all extremities Extremities: No edema. No clubbing or cyanosis. DP/PT pulses 2+ bilaterally Neuro: Alert and oriented. No focal deficits. No facial asymmetry. MAE spontaneously. Psych: Responds to questions appropriately with normal affect.     EKG:  EKG is ordered today. The ekg ordered today demonstrates NSR with non-specific T wave abnormalities, HR 80   Recent Labs: 01/08/2018: B Natriuretic Peptide 809.9; Hemoglobin 15.4; Platelets 288 01/11/2018: TSH 3.18 02/15/2018: ALT 29 07/17/2018: BUN 20; Creatinine, Ser 1.43; Potassium 3.8; Sodium 141   Lipid Panel    Component Value Date/Time   CHOL 209 (H) 02/15/2018 1009   TRIG 148.0 02/15/2018 1009   HDL 36.60 (L) 02/15/2018 1009   CHOLHDL 6 02/15/2018 1009   VLDL 29.6 02/15/2018 1009   LDLCALC 143 (H) 02/15/2018 1009     Wt Readings from Last 3 Encounters:  08/07/18 212 lb 6.4 oz (96.3 kg)  06/26/18 211 lb 12.8 oz (96.1 kg)  03/19/18 212 lb (96.2 kg)     Other studies Reviewed: Additional studies/ records that were reviewed today include:   Echocardiogram 01/14/18:  Study Conclusions  - Left ventricle: The cavity size was severely dilated. Wall   thickness was normal. Systolic function was severely reduced. The   estimated ejection fraction was in the range of 25% to 30%. - Aortic valve: There was mild regurgitation. - Mitral valve: There was moderate regurgitation. - Left atrium: The atrium was mildly dilated. - Right ventricle: The cavity size was mildly dilated. Systolic   function was mildly reduced. - Right atrium: The atrium was moderately dilated. - Pericardium, extracardiac: Small pericardial effusion surrounds   heart, mainly around RA.  Lexiscan Myoview stress test 03/19/18:  Nuclear stress EF:  28%.  Blood pressure demonstrated a hypertensive response to exercise.  There was no ST segment deviation noted during stress.  This is a high risk study.  The left ventricular ejection fraction is severely decreased (<30%).   1. EF 28%, diffuse hypokinesis.  2. Fixed small, mild basal inferior perfusion defect.  Most likely diaphragmatic attenuation.  No ischemia.   Suspect nonischemic cardiomyopathy.  High risk study due to low EF.    ASSESSMENT AND PLAN:  1. Non-ischemic cardiomyopathy/chronic systolic CHF: -Initial echocardiogram 01/14/18 with severe LV dysfunction with EF of 25-30% and moderate MR.  -Ischemic workup included Lexiscan Myoview stress test performed 03/19/18 which showed no ischemia, considered a low risk study -He has been treated for severe cardiomyopthy and has tolerated medication titration well -Continue Entresto 49/51 -Will add Spironolactone 12.5mg  daily and have him return in one month for close follow up  -Check BMET today and again in 2 weeks  -BMET 07/17/18 stable creatinine, 1.43 07/17/18 baseline appears to be in the 1.4 range -Weight, 211lb at last office visit>>>today,  212lb  -Continue ASA, carvedilol, Entresto 49/51, torsemide -Continue K supplementation and will follow with BMET>>last K+ was 3.8 on 07/17/18  2. HTN: -Stable, 124/70 today  -Continue carvedilol, Entresto 49/51, torsemide    Current medicines are reviewed at length with the patient today.  The patient does not have concerns regarding medicines.  The following changes have been made:  Add spironolactone 12.5mg  and follow in one month   Labs/ tests ordered today include: BMET today and again in 2 weeks   Orders Placed This Encounter  Procedures  . Basic Metabolic Panel (BMET)  . Basic Metabolic Panel (BMET)  . EKG 12-Lead    Disposition:   FU with APP or Dr. Elease Hashimoto in 1 month  Signed, Georgie Chard, NP  08/07/2018 10:02 AM    [facility]  [facility] [address] [place], [place], [place]  [address] Phone: [phone]; Fax: [phone]

## 2018-08-07 ENCOUNTER — Ambulatory Visit (INDEPENDENT_AMBULATORY_CARE_PROVIDER_SITE_OTHER): Payer: BLUE CROSS/BLUE SHIELD | Admitting: Cardiology

## 2018-08-07 ENCOUNTER — Encounter: Payer: Self-pay | Admitting: Cardiology

## 2018-08-07 VITALS — BP 124/70 | HR 86 | Ht 74.0 in | Wt 212.4 lb

## 2018-08-07 DIAGNOSIS — I10 Essential (primary) hypertension: Secondary | ICD-10-CM

## 2018-08-07 DIAGNOSIS — I5023 Acute on chronic systolic (congestive) heart failure: Secondary | ICD-10-CM

## 2018-08-07 DIAGNOSIS — I428 Other cardiomyopathies: Secondary | ICD-10-CM

## 2018-08-07 LAB — BASIC METABOLIC PANEL
BUN / CREAT RATIO: 15 (ref 10–24)
BUN: 20 mg/dL (ref 8–27)
CO2: 26 mmol/L (ref 20–29)
CREATININE: 1.37 mg/dL — AB (ref 0.76–1.27)
Calcium: 9.4 mg/dL (ref 8.6–10.2)
Chloride: 101 mmol/L (ref 96–106)
GFR calc Af Amer: 64 mL/min/{1.73_m2} (ref >59)
GFR, EST NON AFRICAN AMERICAN: 56 mL/min/{1.73_m2} — AB (ref >59)
Glucose: 102 mg/dL — ABNORMAL HIGH (ref 65–99)
POTASSIUM: 4 mmol/L (ref 3.5–5.2)
SODIUM: 142 mmol/L (ref 134–144)

## 2018-08-07 MED ORDER — SPIRONOLACTONE 25 MG PO TABS: 12.5000 mg | ORAL_TABLET | Freq: Every day | ORAL | 3 refills | Status: DC

## 2018-08-07 NOTE — Patient Instructions (Signed)
Medication Instructions:  1. START SPIRONOLACTONE 25 MG TABLET WITH THE DIRECTIONS TO TAKE 1/2 TABLET DAILY = 12.5 MG DAILY If you need a refill on your cardiac medications before your next appointment, please call your pharmacy.   Lab work: 1. TODAY BMET  2. BMET TO BE DONE IN 2 WEEKS 08/21/18 BETWEEN 7:30-4:30 If you have labs (blood work) drawn today and your tests are completely normal, you will receive your results only by: [name] Kitchen MyChart Message (if you have MyChart) OR . A paper copy in the mail If you have any lab test that is abnormal or we need to change your treatment, we will call you to review the results.  Testing/Procedures: NONE ORDERED TODAY  Follow-Up: At [facility], you and your health needs are our priority.  As part of our continuing mission to provide you with exceptional heart care, we have created designated Provider Care Teams.  These Care Teams include your primary Cardiologist (physician) and Advanced Practice Providers (APPs -  Physician Assistants and Nurse Practitioners) who all work together to provide you with the care you need, when you need it. You will need a follow up appointment in:  1 months.  Please call our office 2 months in advance to schedule this appointment.  You may see Kristeen Miss, MD or [facility], [facility] ON 09/06/18 @ 8:15 AM one of the following Advanced Practice Providers on your designated Care Team: Tereso Newcomer, PA-C [place], [place] . Berton Bon, NP  Any Other Special Instructions Will Be Listed Below (If Applicable).

## 2018-08-14 ENCOUNTER — Ambulatory Visit (INDEPENDENT_AMBULATORY_CARE_PROVIDER_SITE_OTHER): Payer: BLUE CROSS/BLUE SHIELD | Admitting: Internal Medicine

## 2018-08-14 ENCOUNTER — Encounter: Payer: Self-pay | Admitting: Internal Medicine

## 2018-08-14 VITALS — BP 124/78 | HR 78 | Temp 98.6°F | Resp 16 | Ht 74.0 in | Wt 209.4 lb

## 2018-08-14 DIAGNOSIS — E785 Hyperlipidemia, unspecified: Secondary | ICD-10-CM

## 2018-08-14 DIAGNOSIS — I509 Heart failure, unspecified: Secondary | ICD-10-CM | POA: Diagnosis not present

## 2018-08-14 MED ORDER — ATORVASTATIN CALCIUM 20 MG PO TABS: 20.0000 mg | ORAL_TABLET | Freq: Every day | ORAL | 3 refills | Status: DC

## 2018-08-14 NOTE — Progress Notes (Signed)
Pre visit review using our clinic review tool, if applicable. No additional management support is needed unless otherwise documented below in the visit note. 

## 2018-08-14 NOTE — Patient Instructions (Signed)
  GO TO THE FRONT DESK Schedule fasting labs in 6 weeks  Schedule your next appointment for a  Physical exam 4 to 5 months from today    start lipitor 1 tablet at night

## 2018-08-14 NOTE — Progress Notes (Signed)
Subjective:    Patient ID: Brett Owen, male    DOB: 07/21/1956, 62 y.o.   MRN: [id number]  DOS:  08/14/2018 Type of visit - description :  f/u Interval history: Since the last office visit he is doing very well.  Good compliance with medication. Cardiology notes reviewed  Review of Systems Denies shortness of breath or lower extremity edema.   Past Medical History:  Diagnosis Date  . Cardiomegaly 12/2017   seen on chest x-ray  . CHF (congestive heart failure) (HCC)   . Hypertension   . Renal insufficiency     Past Surgical History:  Procedure Laterality Date  . herniated disc surgery  09/1999   lumbar    Social History   Socioeconomic History  . Marital status: Single    Spouse name: Not on file  . Number of children: 0  . Years of education: Not on file  . Highest education level: Not on file  Occupational History  . Occupation: Office manager, semi -retired   Engineer, production  . Financial resource strain: Not on file  . Food insecurity:    Worry: Not on file    Inability: Not on file  . Transportation needs:    Medical: Not on file    Non-medical: Not on file  Tobacco Use  . Smoking status: Never Smoker  . Smokeless tobacco: Never Used  Substance and Sexual Activity  . Alcohol use: Not Currently    Comment: seasonal, maybe at holidays  . Drug use: Never  . Sexual activity: Not on file  Lifestyle  . Physical activity:    Days per week: Not on file    Minutes per session: Not on file  . Stress: Not on file  Relationships  . Social connections:    Talks on phone: Not on file    Gets together: Not on file    Attends religious service: Not on file    Active member of club or organization: Not on file    Attends meetings of clubs or organizations: Not on file    Relationship status: Not on file  . Intimate partner violence:    Fear of current or ex partner: Not on file    Emotionally abused: Not on file    Physically abused: Not on file    Forced sexual  activity: Not on file  Other Topics Concern  . Not on file  Social History Narrative   Lives by himself      Allergies as of 08/14/2018   No Known Allergies     Medication List        Accurate as of 08/14/18 11:59 PM. Always use your most recent med list.          aspirin EC 81 MG tablet Take 81 mg by mouth daily.   atorvastatin 20 MG tablet Commonly known as:  LIPITOR Take 1 tablet (20 mg total) by mouth daily.   carvedilol 6.25 MG tablet Commonly known as:  COREG Take 1 tablet (6.25 mg total) by mouth 2 (two) times daily.   multivitamin with minerals Tabs tablet Take 1 tablet by mouth daily.   potassium chloride 10 MEQ tablet Commonly known as:  K-DUR Take 1 tablet (10 mEq total) by mouth daily.   sacubitril-valsartan 49-51 MG Commonly known as:  ENTRESTO Take 1 tablet by mouth 2 (two) times daily.   spironolactone 25 MG tablet Commonly known as:  ALDACTONE Take 0.5 tablets (12.5 mg total) by mouth daily.  torsemide 20 MG tablet Commonly known as:  DEMADEX Take 1 tablet (20 mg total) by mouth daily.          Objective:   Physical Exam BP 124/78 (BP Location: Left Arm, Patient Position: Sitting, Cuff Size: Normal)   Pulse 78   Temp 98.6 F (37 C) (Oral)   Resp 16   Ht 6\' 2"  (1.88 m)   Wt 209 lb 6 oz (95 kg)   SpO2 96%   BMI 26.88 kg/m  General:   Well developed, NAD, see BMI.  HEENT:  Normocephalic . Face symmetric, atraumatic Lungs:  CTA B Normal respiratory effort, no intercostal retractions, no accessory muscle use. Heart: RRR,  no murmur.  No pretibial edema bilaterally  Skin: Not pale. Not jaundice Neurologic:  alert & oriented X3.  Speech normal, gait appropriate for age and unassisted Psych--  Cognition and judgment appear intact.  Cooperative with normal attention span and concentration.  Behavior appropriate. No anxious or depressed appearing.      Assessment & Plan:  Assessment HTN new dx 12-2017 CHF new dx  12-2017.   EF 25%, lexiscan 03/2018 (-) ischemia.  You urology and they have been adjusted your medication  Plan: HTN: Seems to be well controlled. CHF: Since the last visit, has been seen by cardiology few times, medications were adjusted.  Continue aspirin, carvedilol, potassium, Entresto, Aldactone, Demadex. Hyperlipidemia: Last LDL 143, CV RF 10 years is 14%.  Discussed pros, cons, side effects of atorvastatin, we agreed to start Lipitor 10 mg 1 p.o. nightly, FLP, AST, ALT in 6 weeks. Preventive care:  We talked about immunizations including Td, pneumonia, shingles, flu shot, he declined all for now.  Benefits discussed.  We also agreed that will discuss prostate and colon cancer screening at the next visit RTC 4-5  months CPX

## 2018-08-15 NOTE — Assessment & Plan Note (Signed)
HTN: Seems to be well controlled. CHF: Since the last visit, has been seen by cardiology few times, medications were adjusted.  Continue aspirin, carvedilol, potassium, Entresto, Aldactone, Demadex. Hyperlipidemia: Last LDL 143, CV RF 10 years is 14%.  Discussed pros, cons, side effects of atorvastatin, we agreed to start Lipitor 10 mg 1 p.o. nightly, FLP, AST, ALT in 6 weeks. Preventive care:  We talked about immunizations including Td, pneumonia, shingles, flu shot, he declined all for now.  Benefits discussed.  We also agreed that will discuss prostate and colon cancer screening at the next visit RTC 4-5  months CPX

## 2018-09-05 ENCOUNTER — Encounter: Payer: Self-pay | Admitting: Physician Assistant

## 2018-09-05 NOTE — Progress Notes (Signed)
Cardiology Office Note:    Date:  09/06/2018   ID:  Brett Owen, DOB [birth year], MRN [id number]  PCP:  Wanda Plump, MD  Cardiologist:  Kristeen Miss, MD   Electrophysiologist:  None   Referring MD: Wanda Plump, MD   Chief Complaint  Patient presents with  . Follow-up    CHF     History of Present Illness:    Brett Owen is a 62 y.o. male with chronic systolic congestive heart failure, hypertension, chronic kidney disease. He was dx with congestive heart failure after presenting to the ED in 12/2017.  An echo demonstrated an EF of 25-30% and moderate mitral regurgitation.  A Nuclear stress test demonstrated no ischemia and was c/w non-ischemic CM.  He was last seen in clinic in 07/2018 by J. Reuel Boom, NP.     Brett Owen returns for follow up on congestive heart failure and further medication titration.  He is here alone.  Since last seen, he has done well.  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  He he was previously a Building control surveyor.  He denies chest discomfort with exertion.  He does have occasional chest pain related to gas and relieved by belching.  He denies significant shortness of breath.  He denies orthopnea, paroxysmal nocturnal dyspnea or lower extremity swelling.  Prior CV studies:   The following studies were reviewed today:  Nuclear stress test 03/19/18 EF 28, diaph atten, no ischemia, probable non-ischemic cardiomyopathy; High Risk due to low EF  Echo 01/14/18 EF 25-30, mild AI, mod MR, mild LAE, mild RV dilation, mild reduced RVSF, mod RAE, small eff  Past Medical History:  Diagnosis Date  . Cardiomegaly 12/2017   seen on chest x-ray  . Chronic systolic heart failure (HCC) 01/15/2018   Nuclear stress test 03/19/18 - EF 28, diaph atten, no ischemia, probable non-ischemic cardiomyopathy; High Risk due to low EF // Echo 01/14/18 - EF 25-30, mild AI, mod MR, mild LAE, mild RV dilation, mild reduced RVSF, mod RAE, small eff  . CKD  (chronic kidney disease)   . Hypertension    Surgical Hx: The patient  has a past surgical history that includes herniated disc surgery (09/1999).   Current Medications: Current Meds  Medication Sig  . aspirin EC 81 MG tablet Take 81 mg by mouth daily.  [name] Kitchen atorvastatin (LIPITOR) 20 MG tablet Take 1 tablet (20 mg total) by mouth daily.  . Multiple Vitamin (MULTIVITAMIN WITH MINERALS) TABS tablet Take 1 tablet by mouth daily.  . potassium chloride (K-DUR) 10 MEQ tablet Take 1 tablet (10 mEq total) by mouth daily.  . sacubitril-valsartan (ENTRESTO) 49-51 MG Take 1 tablet by mouth 2 (two) times daily.  [name] Kitchen spironolactone (ALDACTONE) 25 MG tablet Take 0.5 tablets (12.5 mg total) by mouth daily.  [name] Kitchen torsemide (DEMADEX) 20 MG tablet Take 1 tablet (20 mg total) by mouth daily.  . [DISCONTINUED] carvedilol (COREG) 6.25 MG tablet Take 1 tablet (6.25 mg total) by mouth 2 (two) times daily.     Allergies:   Patient has no known allergies.   Social History   Tobacco Use  . Smoking status: Never Smoker  . Smokeless tobacco: Never Used  Substance Use Topics  . Alcohol use: Not Currently    Comment: seasonal, maybe at holidays  . Drug use: Never     Family Hx: The patient's family history includes Alcohol abuse in his father; Colon cancer (age of onset:  70) in his mother; Hypertension in his paternal grandfather. There is no history of CAD or Prostate cancer.  ROS:   Please see the history of present illness.    ROS All other systems reviewed and are negative.   EKGs/Labs/Other Test Reviewed:    EKG:  EKG is not ordered today.    Recent Labs: 01/08/2018: B Natriuretic Peptide 809.9; Hemoglobin 15.4; Platelets 288 01/11/2018: TSH 3.18 02/15/2018: ALT 29 08/07/2018: BUN 20; Creatinine, Ser 1.37; Potassium 4.0; Sodium 142   Recent Lipid Panel Lab Results  Component Value Date/Time   CHOL 209 (H) 02/15/2018 10:09 AM   TRIG 148.0 02/15/2018 10:09 AM   HDL 36.60 (L) 02/15/2018 10:09 AM    CHOLHDL 6 02/15/2018 10:09 AM   LDLCALC 143 (H) 02/15/2018 10:09 AM    Physical Exam:    VS:  BP 110/60   Pulse 84   Ht 6\' 2"  (1.88 m)   Wt 211 lb 12.8 oz (96.1 kg)   SpO2 98%   BMI 27.19 kg/m     Wt Readings from Last 3 Encounters:  09/06/18 211 lb 12.8 oz (96.1 kg)  08/14/18 209 lb 6 oz (95 kg)  08/07/18 212 lb 6.4 oz (96.3 kg)     Physical Exam  Constitutional: He is oriented to person, place, and time. He appears well-developed and well-nourished. No distress.  HENT:  Head: Normocephalic and atraumatic.  Eyes: No scleral icterus.  Neck: No JVD present. No thyromegaly present.  Cardiovascular: Normal rate and regular rhythm.  No murmur heard. Pulmonary/Chest: Effort normal. He has no rales.  Abdominal: Soft.  Musculoskeletal: He exhibits no edema.  Lymphadenopathy:    He has no cervical adenopathy.  Neurological: He is alert and oriented to person, place, and time.  Skin: Skin is warm and dry.  Psychiatric: He has a normal mood and affect.    ASSESSMENT & PLAN:    Chronic systolic heart failure (HCC) EF 25-30 by echo 4/19.  He is NYHA 1-2.  Volume status is currently stable.  He is on an excellent medical regimen with carvedilol, Entresto, spironolactone.  His heart rate could be better.   -Increase carvedilol to 9.375 mg twice daily.    -Continue current dose of Entresto, spironolactone.    -Obtain follow-up BMET today.    -Arrange follow-up limited echocardiogram to reassess LV function and mitral regurgitation.    -If his EF remains <35%, consider further ischemic work-up +/- referral to EP for ICD.  Essential hypertension The patient's blood pressure is controlled on his current regimen.  Continue current therapy.    CKD (chronic kidney disease) stage 2, GFR 60-89 ml/min Obtain follow-up BMET today.   Dispo:  Return in about 3 months (around 12/07/2018) for Routine Follow Up, w/ Dr. Elease Hashimoto, or Tereso Newcomer, PA-C.   Medication Adjustments/Labs and Tests  Ordered: Current medicines are reviewed at length with the patient today.  Concerns regarding medicines are outlined above.  Tests Ordered: Orders Placed This Encounter  Procedures  . Basic metabolic panel  . ECHOCARDIOGRAM LIMITED   Medication Changes: Meds ordered this encounter  Medications  . carvedilol (COREG) 6.25 MG tablet    Sig: Take 1.5 tablets (9.375 mg total) by mouth 2 (two) times daily.    Dispense:  270 tablet    Refill:  3    Signed, Tereso Newcomer, PA-C  09/06/2018 2:32 PM    [facility] [address] [place], [place], [place]  [address] Phone: [phone]; Fax: [phone]    

## 2018-09-06 ENCOUNTER — Encounter: Payer: Self-pay | Admitting: Physician Assistant

## 2018-09-06 ENCOUNTER — Ambulatory Visit (INDEPENDENT_AMBULATORY_CARE_PROVIDER_SITE_OTHER): Payer: BLUE CROSS/BLUE SHIELD | Admitting: Physician Assistant

## 2018-09-06 VITALS — BP 110/60 | HR 84 | Ht 74.0 in | Wt 211.8 lb

## 2018-09-06 DIAGNOSIS — N182 Chronic kidney disease, stage 2 (mild): Secondary | ICD-10-CM

## 2018-09-06 DIAGNOSIS — I10 Essential (primary) hypertension: Secondary | ICD-10-CM | POA: Diagnosis not present

## 2018-09-06 DIAGNOSIS — I5022 Chronic systolic (congestive) heart failure: Secondary | ICD-10-CM | POA: Diagnosis not present

## 2018-09-06 LAB — BASIC METABOLIC PANEL
BUN / CREAT RATIO: 15 (ref 10–24)
BUN: 22 mg/dL (ref 8–27)
CO2: 25 mmol/L (ref 20–29)
Calcium: 9.6 mg/dL (ref 8.6–10.2)
Chloride: 98 mmol/L (ref 96–106)
Creatinine, Ser: 1.42 mg/dL — ABNORMAL HIGH (ref 0.76–1.27)
GFR calc Af Amer: 62 mL/min/{1.73_m2} (ref >59)
GFR calc non Af Amer: 53 mL/min/{1.73_m2} — ABNORMAL LOW (ref >59)
Glucose: 113 mg/dL — ABNORMAL HIGH (ref 65–99)
POTASSIUM: 4.1 mmol/L (ref 3.5–5.2)
SODIUM: 140 mmol/L (ref 134–144)

## 2018-09-06 MED ORDER — CARVEDILOL 6.25 MG PO TABS: 9.3750 mg | ORAL_TABLET | Freq: Two times a day (BID) | ORAL | 3 refills | Status: DC

## 2018-09-06 NOTE — Patient Instructions (Signed)
Medication Instructions:  Your physician has recommended you make the following change in your medication:  1. INCREASE COREG TO 9.375 (1.5 TABLETS) TWICE DAILY.  If you need a refill on your cardiac medications before your next appointment, please call your pharmacy.   Lab work: TODAY: BMET If you have labs (blood work) drawn today and your tests are completely normal, you will receive your results only by: [name] Kitchen MyChart Message (if you have MyChart) OR . A paper copy in the mail If you have any lab test that is abnormal or we need to change your treatment, we will call you to review the results.  Testing/Procedures: Your physician has requested that you have an echocardiogram. Echocardiography is a painless test that uses sound waves to create images of your heart. It provides your doctor with information about the size and shape of your heart and how well your heart's chambers and valves are working. This procedure takes approximately one hour. There are no restrictions for this procedure.    Follow-Up: At [facility], you and your health needs are our priority.  As part of our continuing mission to provide you with exceptional heart care, we have created designated Provider Care Teams.  These Care Teams include your primary Cardiologist (physician) and Advanced Practice Providers (APPs -  Physician Assistants and Nurse Practitioners) who all work together to provide you with the care you need, when you need it. You will need a follow up appointment in:  3 months.  Please call our office 2 months in advance to schedule this appointment.  You may see Kristeen Miss, MD or one of the following Advanced Practice Providers on your designated Care Team: Tereso Newcomer, PA-C [place], [place] . Berton Bon, NP  Any Other Special Instructions Will Be Listed Below (If Applicable).  Echocardiogram An echocardiogram, or echocardiography, uses sound waves (ultrasound) to produce an image of your  heart. The echocardiogram is simple, painless, obtained within a short period of time, and offers valuable information to your health care provider. The images from an echocardiogram can provide information such as:  Evidence of coronary artery disease (CAD).  Heart size.  Heart muscle function.  Heart valve function.  Aneurysm detection.  Evidence of a past heart attack.  Fluid buildup around the heart.  Heart muscle thickening.  Assess heart valve function.  Tell a health care provider about:  Any allergies you have.  All medicines you are taking, including vitamins, herbs, eye drops, creams, and over-the-counter medicines.  Any problems you or family members have had with anesthetic medicines.  Any blood disorders you have.  Any surgeries you have had.  Any medical conditions you have.  Whether you are pregnant or may be pregnant. What happens before the procedure? No special preparation is needed. Eat and drink normally. What happens during the procedure?  In order to produce an image of your heart, gel will be applied to your chest and a wand-like tool (transducer) will be moved over your chest. The gel will help transmit the sound waves from the transducer. The sound waves will harmlessly bounce off your heart to allow the heart images to be captured in real-time motion. These images will then be recorded.  You may need an IV to receive a medicine that improves the quality of the pictures. What happens after the procedure? You may return to your normal schedule including diet, activities, and medicines, unless your health care provider tells you otherwise. This information is not intended to  replace advice given to you by your health care provider. Make sure you discuss any questions you have with your health care provider. Document Released: 09/29/2000 Document Revised: 05/20/2016 Document Reviewed: 06/09/2013 Elsevier Interactive Patient Education  2017 Elsevier  Inc.    

## 2018-09-09 IMAGING — NM NM MISC PROCEDURE
7 series · 42 of 42 positions shown · non-contrast
Comparison: none

[Series 1: rest · 6.51mm/px · 6 of 64 frames shown (1 of 2)]
[frame 6/64]
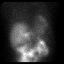
[frame 16/64]
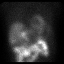
[frame 27/64]
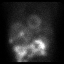
[frame 38/64]
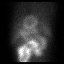
[frame 48/64]
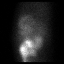
[frame 59/64]
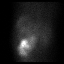

[Series 1: wbr_r-proj_st rest · 6.51mm/px · 6 of 64 frames shown (1 of 2)]
[frame 6/64]
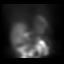
[frame 16/64]
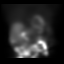
[frame 27/64]
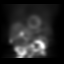
[frame 38/64]
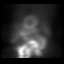
[frame 48/64]
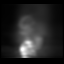
[frame 59/64]
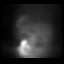

[Series 2: rest · 6.51mm/px · 6 of 64 frames shown (2 of 2)]
[frame 6/64]
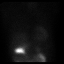
[frame 16/64]
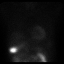
[frame 27/64]
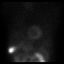
[frame 38/64]
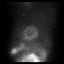
[frame 48/64]
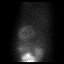
[frame 59/64]
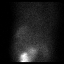

[Series 2: wbr_r-proj_st rest · 6.51mm/px · 6 of 64 frames shown (2 of 2)]
[frame 6/64]
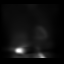
[frame 16/64]
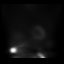
[frame 27/64]
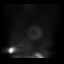
[frame 38/64]
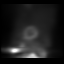
[frame 48/64]
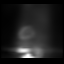
[frame 59/64]
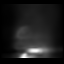

[Series 3: stress - gated · 6.51mm/px · 6 of 512 frames shown]
[frame 43/512]
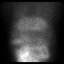
[frame 128/512]
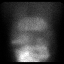
[frame 214/512]
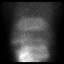
[frame 299/512]
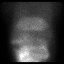
[frame 384/512]
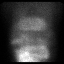
[frame 470/512]
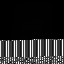

[Series 3: wbr_s-proj_st stress - gated · 6.51mm/px · 6 of 512 frames shown]
[frame 43/512]
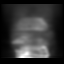
[frame 128/512]
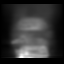
[frame 214/512]
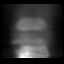
[frame 299/512]
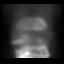
[frame 384/512]
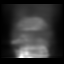
[frame 470/512]
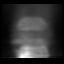

[Series 4: stress - perfusion · 6.51mm/px · 6 of 64 frames shown]
[frame 6/64]
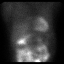
[frame 16/64]
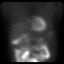
[frame 27/64]
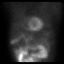
[frame 38/64]
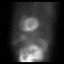
[frame 48/64]
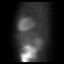
[frame 59/64]
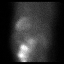

[42 of 42 positions shown; findings below may reference images not displayed]

Canned report from images found in remote index.

Refer to host system for actual result text.

## 2018-09-25 ENCOUNTER — Other Ambulatory Visit (INDEPENDENT_AMBULATORY_CARE_PROVIDER_SITE_OTHER): Payer: BLUE CROSS/BLUE SHIELD

## 2018-09-25 DIAGNOSIS — E785 Hyperlipidemia, unspecified: Secondary | ICD-10-CM | POA: Diagnosis not present

## 2018-09-25 DIAGNOSIS — I5022 Chronic systolic (congestive) heart failure: Secondary | ICD-10-CM

## 2018-09-25 LAB — LIPID PANEL
CHOL/HDL RATIO: 4
CHOLESTEROL: 101 mg/dL (ref 0–200)
HDL: 28.7 mg/dL — AB (ref >39.00)
LDL CALC: 46 mg/dL (ref 0–99)
NonHDL: 71.85
Triglycerides: 128 mg/dL (ref 0.0–149.0)
VLDL: 25.6 mg/dL (ref 0.0–40.0)

## 2018-09-25 LAB — ALT: ALT: 15 U/L (ref 0–53)

## 2018-09-25 LAB — AST: AST: 15 U/L (ref 0–37)

## 2018-09-30 MED ORDER — ATORVASTATIN CALCIUM 20 MG PO TABS: 20.0000 mg | ORAL_TABLET | Freq: Every day | ORAL | 3 refills | Status: DC

## 2018-09-30 NOTE — Addendum Note (Signed)
Addended byConrad Rutledge D on: 09/30/2018 08:59 AM   Modules accepted: Orders

## 2018-10-02 ENCOUNTER — Ambulatory Visit (HOSPITAL_COMMUNITY): Payer: BLUE CROSS/BLUE SHIELD | Attending: Cardiology

## 2018-10-02 ENCOUNTER — Encounter: Payer: Self-pay | Admitting: Physician Assistant

## 2018-10-02 DIAGNOSIS — I5022 Chronic systolic (congestive) heart failure: Secondary | ICD-10-CM

## 2018-11-29 ENCOUNTER — Other Ambulatory Visit: Payer: Self-pay | Admitting: Cardiovascular Disease

## 2018-11-29 NOTE — Telephone Encounter (Signed)
Pharmacy is requesting medication and we have a different sig on file than what is being asked for. Please address. Thank you.

## 2018-12-03 ENCOUNTER — Ambulatory Visit: Payer: BLUE CROSS/BLUE SHIELD | Admitting: Physician Assistant

## 2018-12-03 ENCOUNTER — Encounter (INDEPENDENT_AMBULATORY_CARE_PROVIDER_SITE_OTHER): Payer: Self-pay

## 2018-12-03 ENCOUNTER — Encounter: Payer: Self-pay | Admitting: Physician Assistant

## 2018-12-03 VITALS — BP 110/60 | HR 88 | Ht 74.0 in | Wt 208.8 lb

## 2018-12-03 DIAGNOSIS — I10 Essential (primary) hypertension: Secondary | ICD-10-CM

## 2018-12-03 DIAGNOSIS — I5022 Chronic systolic (congestive) heart failure: Secondary | ICD-10-CM | POA: Diagnosis not present

## 2018-12-03 DIAGNOSIS — N182 Chronic kidney disease, stage 2 (mild): Secondary | ICD-10-CM | POA: Diagnosis not present

## 2018-12-03 NOTE — Progress Notes (Signed)
Cardiology Office Note:    Date:  12/03/2018   ID:  Brett Owen, MRN [id number]  PCP:  Wanda Plump, MD  Cardiologist:  Kristeen Miss, MD  Electrophysiologist:  None   Referring MD: Wanda Plump, MD   Chief Complaint  Patient presents with  . Follow-up    CHF    History of Present Illness:    Brett Owen is a 63 y.o. male with chronic systolic congestive heart failure, hypertension, chronic kidney disease. He was dx with congestive heart failure after presenting to the ED in 12/2017.  An echo demonstrated an EF of 25-30% and moderate mitral regurgitation.  A Nuclear stress test demonstrated no ischemia and was c/w non-ischemic CM.  A follow up echocardiogram in 09/2018 demonstrated that his ejection fraction has improved to normal.    Mr. Brett Owen returns for follow up.  He is here alone.  He denies chest pain, shortness of breath, syncope, orthopnea, PND or significant pedal edema.  He does note discoloration of his fingertips when he touches anything cold.  The fingertips turn white, then blue then red.     Prior CV studies:   The following studies were reviewed today:  Echo 10/02/18 EF 55-60, no RWMA, Gr 1 DD, GLS -13.6%, mild AI, trivial MR, mild LAE, normal RVSF, PASP 23  Nuclear stress test 03/19/18 EF 28, diaph atten, no ischemia, probable non-ischemic cardiomyopathy; High Risk due to low EF  Echo 01/14/18 EF 25-30, mild AI, mod MR, mild LAE, mild RV dilation, mild reduced RVSF, mod RAE, small eff  Past Medical History:  Diagnosis Date  . Cardiomegaly 12/2017   seen on chest x-ray  . Chronic systolic heart failure (HCC) 01/15/2018   Nuclear stress test 03/19/18 - EF 28, diaph atten, no ischemia, probable non-ischemic cardiomyopathy; High Risk due to low EF // Echo 01/14/18 - EF 25-30, mild AI, mod MR, mild LAE, mild RV dilation, mild reduced RVSF, mod RAE, small eff // Echo 12/19: EF 55-60, no RWMA, GR 1 DD, GLS -13.6%, mild AI,trivial MR, mild LAE, normal RVSF,  PASP 23   . CKD (chronic kidney disease)   . Hypertension    Surgical Hx: The patient  has a past surgical history that includes herniated disc surgery (09/1999).   Current Medications: Current Meds  Medication Sig  . aspirin EC 81 MG tablet Take 81 mg by mouth daily.  [name] Kitchen atorvastatin (LIPITOR) 20 MG tablet Take 1 tablet (20 mg total) by mouth daily.  . carvedilol (COREG) 6.25 MG tablet Take 1.5 tablets (9.375 mg total) by mouth 2 (two) times daily.  . Multiple Vitamin (MULTIVITAMIN WITH MINERALS) TABS tablet Take 1 tablet by mouth daily.  . potassium chloride (K-DUR) 10 MEQ tablet Take 1 tablet (10 mEq total) by mouth daily.  . sacubitril-valsartan (ENTRESTO) 49-51 MG Take 1 tablet by mouth 2 (two) times daily.  [name] Kitchen spironolactone (ALDACTONE) 25 MG tablet Take 0.5 tablets (12.5 mg total) by mouth daily.  [name] Kitchen torsemide (DEMADEX) 20 MG tablet Take 1 tablet (20 mg total) by mouth daily.     Allergies:   Patient has no known allergies.   Social History   Tobacco Use  . Smoking status: Never Smoker  . Smokeless tobacco: Never Used  Substance Use Topics  . Alcohol use: Not Currently    Comment: seasonal, maybe at holidays  . Drug use: Never     Family Hx: The patient's family history includes Alcohol abuse in his father;  Colon cancer (age of onset: 37) in his mother; Hypertension in his paternal grandfather. There is no history of CAD or Prostate cancer.  ROS:   Please see the history of present illness.    ROS All other systems reviewed and are negative.   EKGs/Labs/Other Test Reviewed:    EKG:  EKG is   ordered today.  The ekg ordered today demonstrates normal sinus rhythm, HR 88, normal axis, QTc 435, no changes   Recent Labs: 01/08/2018: B Natriuretic Peptide 809.9; Hemoglobin 15.4; Platelets 288 01/11/2018: TSH 3.18 09/06/2018: BUN 22; Creatinine, Ser 1.42; Potassium 4.1; Sodium 140 09/25/2018: ALT 15   Recent Lipid Panel Lab Results  Component Value Date/Time   CHOL  101 09/25/2018 08:32 AM   TRIG 128.0 09/25/2018 08:32 AM   HDL 28.70 (L) 09/25/2018 08:32 AM   CHOLHDL 4 09/25/2018 08:32 AM   LDLCALC 46 09/25/2018 08:32 AM    Physical Exam:    VS:  BP 110/60   Pulse 88   Ht 6\' 2"  (1.88 m)   Wt 208 lb 12.8 oz (94.7 kg)   SpO2 98%   BMI 26.81 kg/m     Wt Readings from Last 3 Encounters:  12/03/18 208 lb 12.8 oz (94.7 kg)  09/06/18 211 lb 12.8 oz (96.1 kg)  08/14/18 209 lb 6 oz (95 kg)     Physical Exam  Constitutional: He is oriented to person, place, and time. He appears well-developed and well-nourished. No distress.  HENT:  Head: Normocephalic and atraumatic.  Neck: Neck supple. No JVD present.  Cardiovascular: Normal rate, regular rhythm, S1 normal and S2 normal.  No murmur heard. Pulmonary/Chest: Breath sounds normal. He has no rales.  Abdominal: Soft. There is no hepatomegaly.  Musculoskeletal:        General: No edema.  Neurological: He is alert and oriented to person, place, and time.  Skin: Skin is warm and dry.    ASSESSMENT & PLAN:    Chronic systolic heart failure (HCC)  EF improved from 25-30 to 55-60 on med Rx.  Therefore, he has a NICM.  NYHA 1.  Volume status stable.  Continue beta-blocker, angiotensin receptor neprilysin inhibitor, mineralocorticoid antagonist.  He can try to take Torsemide and K+ as needed. We discussed this today.  FU with Dr. Elease Hashimoto or me in 6 mos.  Essential hypertension  The patient's blood pressure is controlled on his current regimen.  Continue current therapy.    Raynaud's Phenomenon  He seems to describe symptoms of Raynaud's.  This may have been exacerbated by Coreg.  I have asked him to try conservative measures for now.  If it remains troublesome, we could try low dose Amlodipine.   Dispo:  Return in about 6 months (around 06/03/2019) for Routine Follow Up, w/ Dr. Elease Hashimoto, or Tereso Newcomer, PA-C.   Medication Adjustments/Labs and Tests Ordered: Current medicines are reviewed at length  with the patient today.  Concerns regarding medicines are outlined above.  Tests Ordered: Orders Placed This Encounter  Procedures  . EKG 12-Lead   Medication Changes: No orders of the defined types were placed in this encounter.   Signed, Tereso Newcomer, PA-C  12/03/2018 4:53 PM    [facility] [address] [place], [place], [place]  [address] Phone: [phone]; Fax: [phone]

## 2018-12-03 NOTE — Patient Instructions (Signed)
Medication Instructions:  Your physician recommends that you continue on your current medications as directed. Please refer to the Current Medication list given to you today.   If you need a refill on your cardiac medications before your next appointment, please call your pharmacy.   Lab work: NONE  If you have labs (blood work) drawn today and your tests are completely normal, you will receive your results only by: . MyChart Message (if you have MyChart) OR . A paper copy in the mail If you have any lab test that is abnormal or we need to change your treatment, we will call you to review the results.  Testing/Procedures: NONE  Follow-Up: At [facility], you and your health needs are our priority.  As part of our continuing mission to provide you with exceptional heart care, we have created designated Provider Care Teams.  These Care Teams include your primary Cardiologist (physician) and Advanced Practice Providers (APPs -  Physician Assistants and Nurse Practitioners) who all work together to provide you with the care you need, when you need it. . You will need a follow up appointment in:  6 months.  Please call our office 2 months in advance to schedule this appointment.  You may see Philip Nahser, MD or Scott Weaver, PA-C.   Any Other Special Instructions Will Be Listed Below (If Applicable).    

## 2019-01-29 ENCOUNTER — Telehealth: Payer: Self-pay

## 2019-01-29 ENCOUNTER — Encounter: Payer: BLUE CROSS/BLUE SHIELD | Admitting: Internal Medicine

## 2019-01-29 NOTE — Telephone Encounter (Signed)
LMOM informing Pt that he is due for OV w/ PCP. Instructed to call and schedule virtual visit.

## 2019-01-30 NOTE — Telephone Encounter (Signed)
CRM # [phone]  Owner: None  Status: Unresolved  Priority: Routine Created on: 01/30/2019 11:38 AM By: Percival Spanish  Primary Information   Source Subject Topic  Brett Owen (Patient) Brett Owen (Patient) General - Other    Pt lvm on [facility] appt line , he said at this time he does not want to reschedule his CPE he say he may call in the fall to reschedule

## 2019-02-14 ENCOUNTER — Telehealth: Payer: Self-pay

## 2019-02-14 NOTE — Telephone Encounter (Signed)
LMOM informing Pt that [employer] is allowing virtual cpe's- informed to call office to reschedule at his convenience.

## 2019-05-21 ENCOUNTER — Encounter: Payer: Self-pay | Admitting: Internal Medicine

## 2019-08-08 ENCOUNTER — Telehealth: Payer: Self-pay | Admitting: Cardiovascular Disease

## 2019-08-08 MED ORDER — ENTRESTO 49-51 MG PO TABS: 1.0000 | ORAL_TABLET | Freq: Two times a day (BID) | ORAL | 0 refills | Status: DC

## 2019-08-08 NOTE — Telephone Encounter (Signed)
°*  STAT* If patient is at the pharmacy, call can be transferred to refill team.   1. Which medications need to be refilled? (please list name of each medication and dose if known)   sacubitril-valsartan (ENTRESTO) 49-51 MG  2. Which pharmacy/location (including street and city if local pharmacy) is medication to be sent to?   [facility] [address], [place]  3. Do they need a 30 day or 90 day supply? [place]

## 2019-08-08 NOTE — Telephone Encounter (Signed)
Pt's medication was sent to pt's pharmacy as requested. Confirmation received.  °

## 2019-08-10 ENCOUNTER — Other Ambulatory Visit: Payer: Self-pay | Admitting: Cardiovascular Disease

## 2019-08-13 ENCOUNTER — Telehealth: Payer: Self-pay

## 2019-08-13 NOTE — Telephone Encounter (Signed)
**Note De-Identified Ashtian Villacis Obfuscation** I have started a [employer] PA through covermymeds. Key: [id number]

## 2019-08-15 NOTE — Telephone Encounter (Signed)
I got a message through covermymeds stating that an appeal is needed on the pts Entrestp PA.  II attempted an appeal through covermymeds but was not able so I called [employer] several times. I was transferred multiple times and eventually the call ended on there end.  I started another [place] PA request through covermymeds with a new key in hopes of an approval on this one. Key: [id number]

## 2019-08-18 NOTE — Telephone Encounter (Signed)
Reason for denial: I mistakenly answered yes the question "does the pt have severe hepatic impairment" and should have answered no as he does not.  I called [employer] and did an appeal over the phone with Sharyn Lull. Per Sharyn Lull this Pa is now approved from 08/18/2019 until 08/17/2020.  ID: [id number]

## 2019-09-23 ENCOUNTER — Other Ambulatory Visit: Payer: Self-pay

## 2019-09-23 ENCOUNTER — Encounter: Payer: Self-pay | Admitting: Cardiovascular Disease

## 2019-09-23 ENCOUNTER — Ambulatory Visit (INDEPENDENT_AMBULATORY_CARE_PROVIDER_SITE_OTHER): Payer: BC Managed Care – PPO | Admitting: Cardiovascular Disease

## 2019-09-23 VITALS — BP 138/68 | HR 64 | Ht 74.0 in | Wt 203.1 lb

## 2019-09-23 DIAGNOSIS — I10 Essential (primary) hypertension: Secondary | ICD-10-CM

## 2019-09-23 DIAGNOSIS — E782 Mixed hyperlipidemia: Secondary | ICD-10-CM

## 2019-09-23 DIAGNOSIS — I5022 Chronic systolic (congestive) heart failure: Secondary | ICD-10-CM

## 2019-09-23 NOTE — Patient Instructions (Signed)
Medication Instructions:  Your physician recommends that you continue on your current medications as directed. Please refer to the Current Medication list given to you today.  *If you need a refill on your cardiac medications before your next appointment, please call your pharmacy*  Lab Work: TODAY - cholesterol, liver panel, BMET  If you have labs (blood work) drawn today and your tests are completely normal, you will receive your results only by: [name] Kitchen MyChart Message (if you have MyChart) OR . A paper copy in the mail If you have any lab test that is abnormal or we need to change your treatment, we will call you to review the results.  Testing/Procedures: None Ordered   Follow-Up: At [facility], you and your health needs are our priority.  As part of our continuing mission to provide you with exceptional heart care, we have created designated Provider Care Teams.  These Care Teams include your primary Cardiologist (physician) and Advanced Practice Providers (APPs -  Physician Assistants and Nurse Practitioners) who all work together to provide you with the care you need, when you need it.  Your next appointment:   6 month(s)  The format for your next appointment:   Either In Person or Virtual  Provider:   You may see Mertie Moores, MD or one of the following Advanced Practice Providers on your designated Care Team:    Richardson Dopp, PA-C  [place], [place]  Daune Perch, [place]

## 2019-09-23 NOTE — Progress Notes (Signed)
Cardiology Office Note:    Date:  09/23/2019   ID:  Brett Owen, DOB 05/15/1956, MRN [id number]  PCP:  Colon Branch, MD  Cardiologist:  Mertie Moores, MD   Referring MD: Colon Branch, MD   Problem list 1.  Acute on chronic combined systolic /  Diastolic  congestive heart failure: 2.  Essential hypertension 3.  Renal insufficiency  Chief Complaint  Patient presents with  . Congestive Heart Failure    January 15, 2018    Brett Owen is a 63 y.o. male with a hx of hypertension and chronic kidney disease.  He is referred to see me for worsening shortness of breath.  He had an echocardiogram yesterday which showed severe left ventricular dysfunction with an ejection fraction of 25-30%.  He has mild aortic insufficiency, moderate mitral regurgitation.  There was a small pericardial effusion.  9 days ago, started having weight gain 1-2 lbs a week.   Developed swelling in ankles.  Progressed to calves and knees and eventually in the abd. Was not able to eat full meals Has developed shortness of breath,   HR has been 105 - 115  Has developed a dry cough,  Cough is worse with exertion + PND and orthopnea ,  Sleeps in a relciner propped up  15 lb weight gain .  Has not gone to the doctor regularly  Went to the ER and then Dr. Larose Kells  In the [facility] room for HTN he was started on amlodipine 5 mg a day and HCTZ 25 mg a day.  He is he saw Dr. Larose Kells  several days later.  The HCTZ was discontinued and he was started on Lasix.  Carvedilol 3.125 mg twice a day was started.   The abdominal pressure has resolved slightly .  Has stopped gaining weight. Still has PND and orthopnea  No chest pain, no dizziness, no blurry vision  Tries to limit his salt intake. Has had some CKD  - Creatinine is 1.6   Mother had HTN, maternal grandfather had HTN,   Retired Art gallery manager , now works security  Does not exercise regularly .   April 16, 2 019: Brett Owen seen today for follow-up of his  acute on chronic combined systolic and diastolic congestive heart failure. He has had some episodes of orthostatic hypotension. Breathing is much better, no PND or orthopnea  Ankle edema is much better  HR is much better.  He was tachycardic - now is well controlled   Mar 04, 2018:     Brett Owen is seen back today for follow-up of his congestive heart failure. Is doing well.   Is walking twice a week.   Is mowing his grass  June 26, 2018: Brett Owen seen today for follow-up of his congestive heart failure. Doing well   September 23, 2019: Brett Owen is seen today for follow-up of his chronic combined systolic and diastolic congestive heart failure.  BP is a bit up a bit.  Has been eating some extra recently  BP at home is 120-130  No CP or dyspnea.   Past Medical History:  Diagnosis Date  . Cardiomegaly 12/2017   seen on chest x-ray  . Chronic systolic heart failure ([place]) 01/15/2018   Nuclear stress test 03/19/18 - EF 28, diaph atten, no ischemia, probable non-ischemic cardiomyopathy; High Risk due to low EF // Echo 01/14/18 - EF 25-30, mild AI, mod MR, mild LAE, mild RV dilation, mild reduced RVSF, mod RAE, small eff //  Echo 12/19: EF 55-60, no RWMA, GR 1 DD, GLS -13.6%, mild AI,trivial MR, mild LAE, normal RVSF, PASP 23   . CKD (chronic kidney disease)   . Hypertension     Past Surgical History:  Procedure Laterality Date  . herniated disc surgery  09/1999   lumbar    Current Medications: Current Meds  Medication Sig  . aspirin EC 81 MG tablet Take 81 mg by mouth daily.  [name] Kitchen atorvastatin (LIPITOR) 20 MG tablet Take 1 tablet (20 mg total) by mouth daily.  . carvedilol (COREG) 6.25 MG tablet Take 1.5 tablets (9.375 mg total) by mouth 2 (two) times daily.  [name] Kitchen ENTRESTO 49-51 MG Take 1 tablet by mouth twice daily  . Multiple Vitamin (MULTIVITAMIN WITH MINERALS) TABS tablet Take 1 tablet by mouth daily.  . [DISCONTINUED] torsemide (DEMADEX) 20 MG tablet Take 1 tablet (20 mg total) by mouth  daily.     Allergies:   Patient has no known allergies.   Social History   Socioeconomic History  . Marital status: Single    Spouse name: Not on file  . Number of children: 0  . Years of education: Not on file  . Highest education level: Not on file  Occupational History  . Occupation: Office manager, semi -retired   Engineer, production  . Financial resource strain: Not on file  . Food insecurity    Worry: Not on file    Inability: Not on file  . Transportation needs    Medical: Not on file    Non-medical: Not on file  Tobacco Use  . Smoking status: Never Smoker  . Smokeless tobacco: Never Used  Substance and Sexual Activity  . Alcohol use: Not Currently    Comment: seasonal, maybe at holidays  . Drug use: Never  . Sexual activity: Not on file  Lifestyle  . Physical activity    Days per week: Not on file    Minutes per session: Not on file  . Stress: Not on file  Relationships  . Social Musician on phone: Not on file    Gets together: Not on file    Attends religious service: Not on file    Active member of club or organization: Not on file    Attends meetings of clubs or organizations: Not on file    Relationship status: Not on file  Other Topics Concern  . Not on file  Social History Narrative   Lives by himself     Family History: The patient's family history includes Alcohol abuse in his father; Colon cancer (age of onset: 74) in his mother; Hypertension in his paternal grandfather. There is no history of CAD or Prostate cancer.  ROS:   He has had some episodes of orthostatic hypotension..  EKGs/Labs/Other Studies Reviewed:      EKG:   Recent Labs: 09/25/2018: ALT 15  Recent Lipid Panel    Component Value Date/Time   CHOL 101 09/25/2018 0832   TRIG 128.0 09/25/2018 0832   HDL 28.70 (L) 09/25/2018 0832   CHOLHDL 4 09/25/2018 0832   VLDL 25.6 09/25/2018 0832   LDLCALC 46 09/25/2018 0832    Physical Exam: Blood pressure 138/68, pulse 64,  height 6\' 2"  (1.88 m), weight 203 lb 1.9 oz (92.1 kg), SpO2 99 %.  GEN:  Well nourished, well developed in no acute distress HEENT: Normal NECK: No JVD; No carotid bruits LYMPHATICS: No lymphadenopathy CARDIAC: RRR , no murmurs, rubs, gallops RESPIRATORY:  Clear to auscultation  without rales, wheezing or rhonchi  ABDOMEN: Soft, non-tender, non-distended MUSCULOSKELETAL:  No edema; No deformity  SKIN: Warm and dry NEUROLOGIC:  Alert and oriented x 3    ASSESSMENT:/ PLAN:    In order of problems listed above:  Acute on Chronic combined systolic and diastolic congestive heart failure: We will increase the Entresto up to 49 mg - 51 mg twice a day.  He seems to be tolerating the current dose well.  Anticipate adding Aldactone or increase in carvedilol at his next visit. We will see him again in 6 weeks for follow-up evaluation.  We will check a basic metabolic profile in 3 weeks.  He has had some episodes of orthostatic hypotension and also one episode of micturition syncope. I do not think that we should increase his medications or add spironolactone at this point. We will continue to reevaluate him and can potentially add in the future if needed.  2.  Hypertension:   BP has been well controlled.   3.  Hyperlipidemia :  Cont. Atorvastatin Check lipids, liver, bmp today   Medication Adjustments/Labs and Tests Ordered:  Current medicines are reviewed at length with the patient today.  Concerns regarding medicines are outlined above.  Orders Placed This Encounter  Procedures  . Lipid Profile  . Basic Metabolic Panel (BMET)  . Hepatic function panel   No orders of the defined types were placed in this encounter.   Signed, Kristeen Miss, MD  09/23/2019 5:09 PM    [facility]

## 2019-09-24 LAB — LIPID PANEL
Chol/HDL Ratio: 3 ratio (ref 0.0–5.0)
Cholesterol, Total: 108 mg/dL (ref 100–199)
HDL: 36 mg/dL — ABNORMAL LOW (ref >39)
LDL Chol Calc (NIH): 56 mg/dL (ref 0–99)
Triglycerides: 78 mg/dL (ref 0–149)
VLDL Cholesterol Cal: 16 mg/dL (ref 5–40)

## 2019-09-24 LAB — BASIC METABOLIC PANEL
BUN/Creatinine Ratio: 16 (ref 10–24)
BUN: 20 mg/dL (ref 8–27)
CO2: 25 mmol/L (ref 20–29)
Calcium: 9.2 mg/dL (ref 8.6–10.2)
Chloride: 102 mmol/L (ref 96–106)
Creatinine, Ser: 1.23 mg/dL (ref 0.76–1.27)
GFR calc Af Amer: 72 mL/min/{1.73_m2} (ref >59)
GFR calc non Af Amer: 63 mL/min/{1.73_m2} (ref >59)
Glucose: 90 mg/dL (ref 65–99)
Potassium: 4.7 mmol/L (ref 3.5–5.2)
Sodium: 141 mmol/L (ref 134–144)

## 2019-09-24 LAB — HEPATIC FUNCTION PANEL
ALT: 22 IU/L (ref 0–44)
AST: 18 IU/L (ref 0–40)
Albumin: 4.5 g/dL (ref 3.8–4.8)
Alkaline Phosphatase: 111 IU/L (ref 39–117)
Bilirubin Total: 1.1 mg/dL (ref 0.0–1.2)
Bilirubin, Direct: 0.24 mg/dL (ref 0.00–0.40)
Total Protein: 6.9 g/dL (ref 6.0–8.5)

## 2019-10-25 IMAGING — CR DG CHEST 2V
2 series · 2 of 2 positions shown · non-contrast
Comparison: None.

CLINICAL DATA: Shortness of breath. Bilateral lower extremity
swelling.

EXAM:
CHEST - 2 VIEW

[w chest pa]
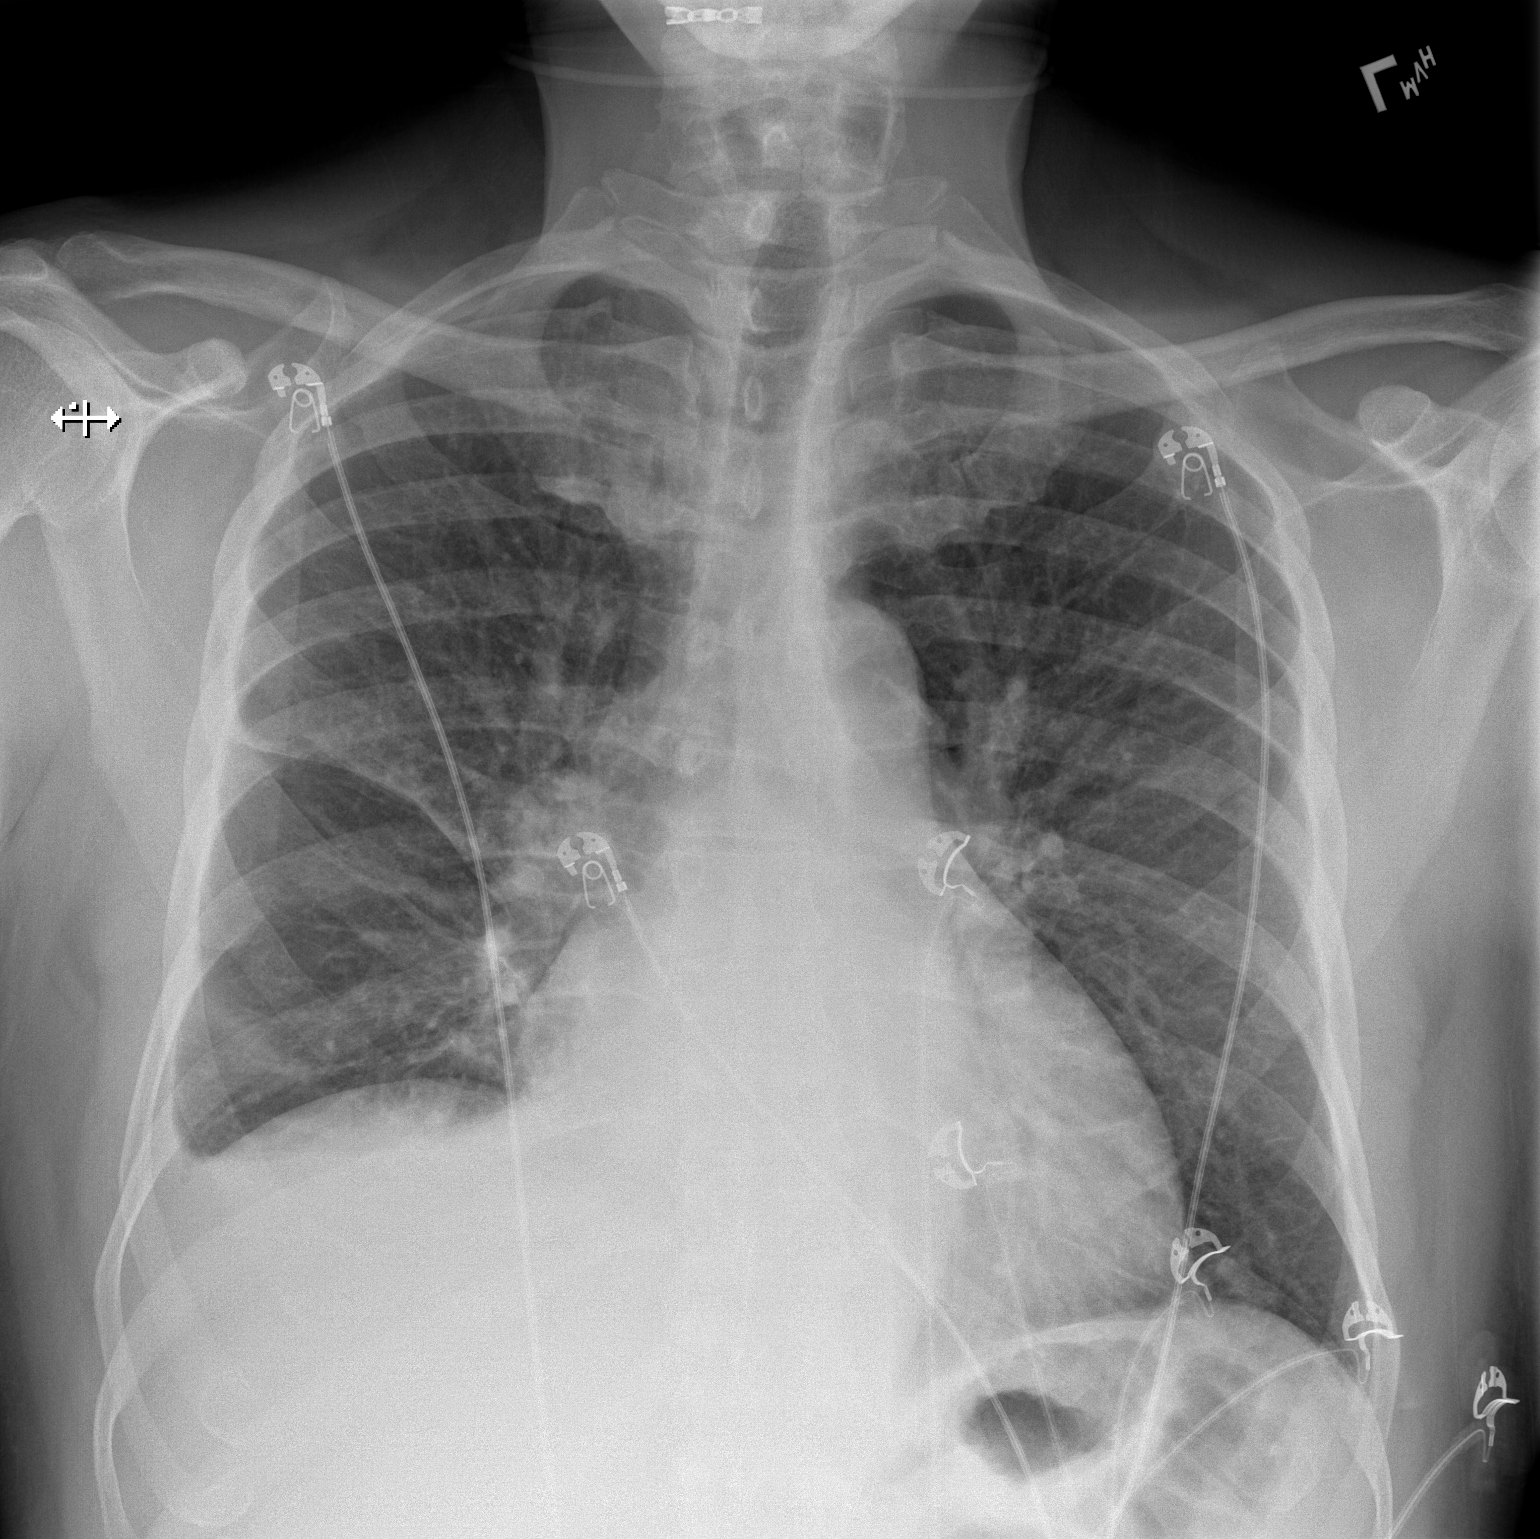

[w chest lat]
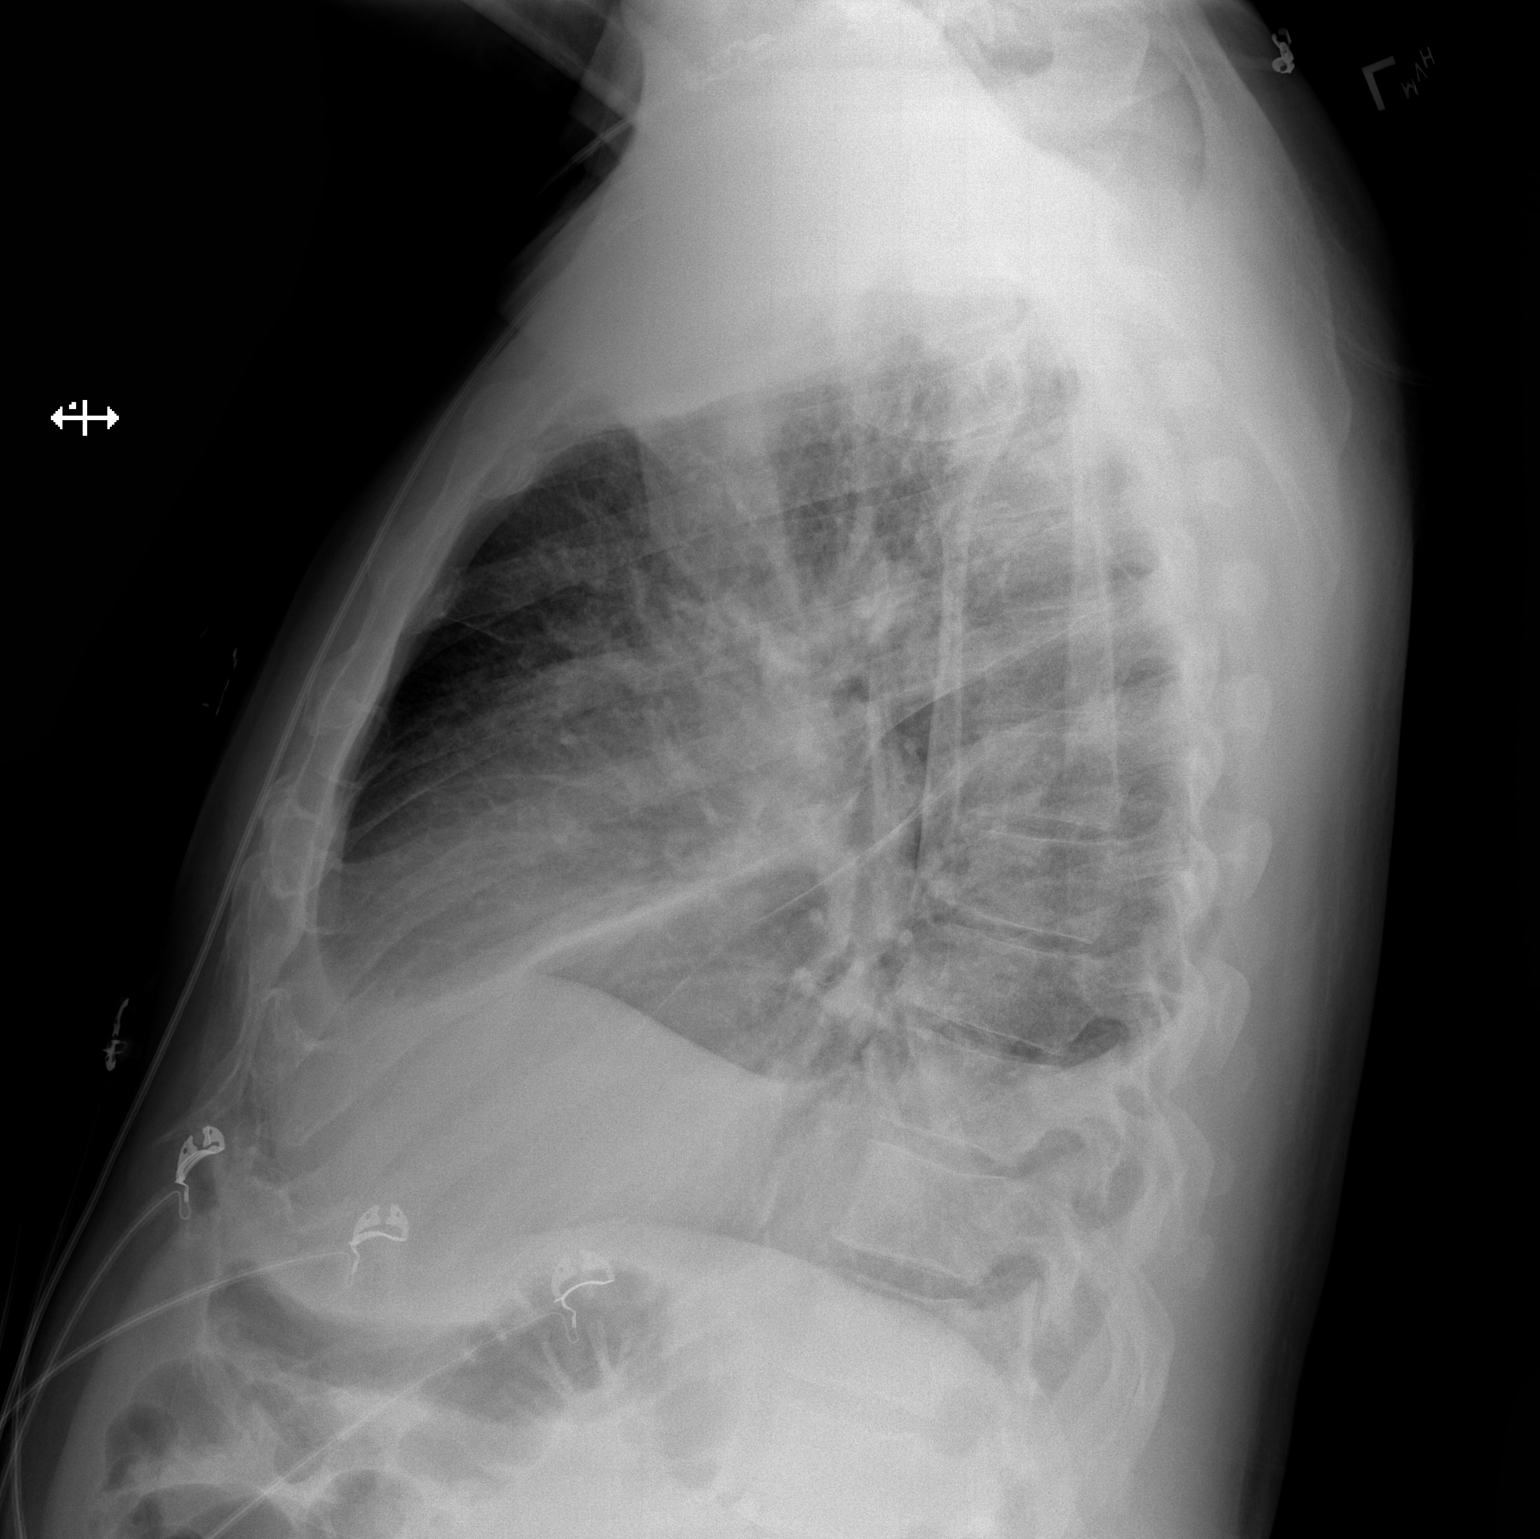

[2 of 2 positions shown; findings below may reference images not displayed]

FINDINGS: The cardiac silhouette is mildly enlarged. There is elevation of the
right hemidiaphragm. A small right pleural effusion is present.
There may be a trace left pleural effusion. There is mild pulmonary
vascular congestion without overt edema. No pneumothorax is
identified. No acute osseous abnormality is seen.
IMPRESSION: 1. Cardiomegaly and pulmonary vascular congestion.
2. Small right and possible trace left pleural effusions.

## 2019-11-30 ENCOUNTER — Other Ambulatory Visit: Payer: Self-pay | Admitting: Physician Assistant

## 2019-11-30 ENCOUNTER — Other Ambulatory Visit: Payer: Self-pay | Admitting: Internal Medicine

## 2019-12-04 ENCOUNTER — Ambulatory Visit (INDEPENDENT_AMBULATORY_CARE_PROVIDER_SITE_OTHER): Payer: BC Managed Care – PPO | Admitting: Internal Medicine

## 2019-12-04 ENCOUNTER — Telehealth: Payer: Self-pay

## 2019-12-04 VITALS — BP 121/62 | Wt 205.0 lb

## 2019-12-04 DIAGNOSIS — I7300 Raynaud's syndrome without gangrene: Secondary | ICD-10-CM | POA: Insufficient documentation

## 2019-12-04 DIAGNOSIS — I10 Essential (primary) hypertension: Secondary | ICD-10-CM | POA: Diagnosis not present

## 2019-12-04 DIAGNOSIS — I509 Heart failure, unspecified: Secondary | ICD-10-CM | POA: Diagnosis not present

## 2019-12-04 MED ORDER — CARVEDILOL 6.25 MG PO TABS: 9.3750 mg | ORAL_TABLET | Freq: Two times a day (BID) | ORAL | 1 refills | Status: DC

## 2019-12-04 NOTE — Assessment & Plan Note (Signed)
Last visit w/ me more than a year ago HTN: Will refill carvedilol,   Entresto per cardiology.  No vital signs today but on reviewing the chart BPs are very good. CHF: Managed by cardiology, he reports 2 episodes of LOC, he already discussed the issue with cardiology, likely due to transient low BP in the context of going to the restroom in the middle of the night.  Advised to get up very slow if he must go to the restroom at night to prevent orthostatic hypotension. Hyperlipidemia: On atorvastatin, controlled Raynaud's phenomenon: Symptoms consistent with this DX, avoid cold , call if symptoms severe RTC 3 months CPX, will call the patient.

## 2019-12-04 NOTE — Telephone Encounter (Signed)
PA initiated via Covermymeds; KEY: [id number]. Awaiting determination.

## 2019-12-04 NOTE — Progress Notes (Signed)
Subjective:    Patient ID: Brett Owen, male    DOB: [birth year], 64 y.o.   MRN: [id number]  DOS:  12/04/2019 Type of visit - description: Virtual Visit via Video Note  I connected with the above patient  by a video enabled telemedicine application and verified that I am speaking with the correct person using two identifiers.   THIS ENCOUNTER IS A VIRTUAL VISIT DUE TO COVID-19 - PATIENT WAS NOT SEEN IN THE OFFICE. PATIENT HAS CONSENTED TO VIRTUAL VISIT / TELEMEDICINE VISIT   Location of patient: home  Location of provider: office  I discussed the limitations of evaluation and management by telemedicine and the availability of in person appointments. The patient expressed understanding and agreed to proceed.  Follow-up Since the last visit more than a year ago he has been followed with by cardiology, notes reviewed. He reports 2 episodes of LOC in the context of going to urinate in the middle of the night  fortunately no major injury. He already discussed this with cardiology and he understood symptoms were due to low BP.  Also has developed Raynaud phenomenon: When exposed to cold, fingers get pale and then bluish and decreased sensitivity. It can be 1 or 2 fingers or multiple ones; sxs fortunately are not persistent.      BP Readings from Last 3 Encounters:  12/04/19 121/62  09/23/19 138/68  12/03/18 110/60     Review of Systems See above   Past Medical History:  Diagnosis Date  . Cardiomegaly 12/2017   seen on chest x-ray  . Chronic systolic heart failure ([facility]) 01/15/2018   Nuclear stress test 03/19/18 - EF 28, diaph atten, no ischemia, probable non-ischemic cardiomyopathy; High Risk due to low EF // Echo 01/14/18 - EF 25-30, mild AI, mod MR, mild LAE, mild RV dilation, mild reduced RVSF, mod RAE, small eff // Echo 12/19: EF 55-60, no RWMA, GR 1 DD, GLS -13.6%, mild AI,trivial MR, mild LAE, normal RVSF, PASP 23   . CKD (chronic kidney disease)   . Hypertension     Past  Surgical History:  Procedure Laterality Date  . herniated disc surgery  09/1999   lumbar    Allergies as of 12/04/2019   No Known Allergies     Medication List       Accurate as of December 04, 2019  9:05 AM. If you have any questions, ask your nurse or doctor.        aspirin EC 81 MG tablet Take 81 mg by mouth daily.   atorvastatin 20 MG tablet Commonly known as: Lipitor Take 1 tablet (20 mg total) by mouth daily.   carvedilol 6.25 MG tablet Commonly known as: COREG TAKE 1 & 1/2 (ONE & ONE-HALF) TABLETS BY MOUTH TWICE DAILY   Entresto 49-51 MG Generic drug: sacubitril-valsartan Take 1 tablet by mouth twice daily   multivitamin with minerals Tabs tablet Take 1 tablet by mouth daily.         Objective:   Physical Exam There were no vitals taken for this visit. This is a virtual video visit, he is alert oriented x3.  No vital signs taken by the patient today.    Assessment     Assessment HTN new dx 12-2017 CHF new dx  12-2017.  EF 25%, lexiscan 03/2018 (-) ischemia.    PLAN Last visit w/ me more than a year ago HTN: Will refill carvedilol,   Entresto per cardiology.  No vital signs today but on reviewing  the chart BPs are very good. CHF: Managed by cardiology, he reports 2 episodes of LOC, he already discussed the issue with cardiology, likely due to transient low BP in the context of going to the restroom in the middle of the night.  Advised to get up very slow if he must go to the restroom at night to prevent orthostatic hypotension. Hyperlipidemia: On atorvastatin, controlled Raynaud's phenomenon: Symptoms consistent with this DX, avoid cold , call if symptoms severe RTC 3 months CPX, will call the patient.   I discussed the assessment and treatment plan with the patient. The patient was provided an opportunity to ask questions and all were answered. The patient agreed with the plan and demonstrated an understanding of the instructions.   The patient was  advised to call back or seek an in-person evaluation if the symptoms worsen or if the condition fails to improve as anticipated.

## 2019-12-06 ENCOUNTER — Other Ambulatory Visit: Payer: Self-pay | Admitting: Internal Medicine

## 2019-12-08 NOTE — Telephone Encounter (Signed)
PA approved.  PA Case: [id number], Status: Approved, Coverage Starts on: 12/08/2019 12:00:00 AM, Coverage Ends on: 12/07/2020 12:00:00 AM.

## 2020-03-03 ENCOUNTER — Encounter: Payer: Self-pay | Admitting: Internal Medicine

## 2020-03-03 ENCOUNTER — Other Ambulatory Visit: Payer: Self-pay

## 2020-03-03 ENCOUNTER — Telehealth: Payer: Self-pay

## 2020-03-03 ENCOUNTER — Ambulatory Visit (INDEPENDENT_AMBULATORY_CARE_PROVIDER_SITE_OTHER): Payer: BC Managed Care – PPO | Admitting: Internal Medicine

## 2020-03-03 VITALS — BP 134/81 | HR 85 | Temp 96.3°F | Resp 18 | Ht 74.0 in | Wt 212.0 lb

## 2020-03-03 DIAGNOSIS — Z0000 Encounter for general adult medical examination without abnormal findings: Secondary | ICD-10-CM | POA: Diagnosis not present

## 2020-03-03 LAB — CBC WITH DIFFERENTIAL/PLATELET
Basophils Absolute: 0.1 10*3/uL (ref 0.0–0.1)
Basophils Relative: 1.4 % (ref 0.0–3.0)
Eosinophils Absolute: 0.2 10*3/uL (ref 0.0–0.7)
Eosinophils Relative: 3.5 % (ref 0.0–5.0)
HCT: 46.4 % (ref 39.0–52.0)
Hemoglobin: 15.7 g/dL (ref 13.0–17.0)
Lymphocytes Relative: 22.4 % (ref 12.0–46.0)
Lymphs Abs: 1.1 10*3/uL (ref 0.7–4.0)
MCHC: 33.9 g/dL (ref 30.0–36.0)
MCV: 91.2 fl (ref 78.0–100.0)
Monocytes Absolute: 0.7 10*3/uL (ref 0.1–1.0)
Monocytes Relative: 14.9 % — ABNORMAL HIGH (ref 3.0–12.0)
Neutro Abs: 2.7 10*3/uL (ref 1.4–7.7)
Neutrophils Relative %: 57.8 % (ref 43.0–77.0)
Platelets: 224 10*3/uL (ref 150.0–400.0)
RBC: 5.08 Mil/uL (ref 4.22–5.81)
RDW: 12.2 % (ref 11.5–15.5)
WBC: 4.7 10*3/uL (ref 4.0–10.5)

## 2020-03-03 LAB — BASIC METABOLIC PANEL
BUN: 19 mg/dL (ref 6–23)
CO2: 29 mEq/L (ref 19–32)
Calcium: 9.3 mg/dL (ref 8.4–10.5)
Chloride: 106 mEq/L (ref 96–112)
Creatinine, Ser: 1.23 mg/dL (ref 0.40–1.50)
GFR: 72.03 mL/min (ref >60.00)
Glucose, Bld: 74 mg/dL (ref 70–99)
Potassium: 4.1 mEq/L (ref 3.5–5.1)
Sodium: 141 mEq/L (ref 135–145)

## 2020-03-03 LAB — TSH: TSH: 2.37 u[IU]/mL (ref 0.35–4.50)

## 2020-03-03 LAB — PSA: PSA: 2.99 ng/mL (ref 0.10–4.00)

## 2020-03-03 LAB — HEMOGLOBIN A1C: Hgb A1c MFr Bld: 5.8 % (ref 4.6–6.5)

## 2020-03-03 MED ORDER — ATORVASTATIN CALCIUM 20 MG PO TABS: 20.0000 mg | ORAL_TABLET | Freq: Every day | ORAL | 3 refills | Status: DC

## 2020-03-03 NOTE — Patient Instructions (Addendum)
COVID-19 Vaccine Information can be found at: [email or web address] For questions related to vaccine distribution or appointments, please email [email or web address] or call [phone].   Check your blood pressure weekly BP GOAL is between 110/65 and  135/85. If it is consistently higher or lower, let me know  Proceed with your Covid vaccination. After that you are due for: Tetanus shot Pneumonia shot Shingles shot (Shingrix)   GO TO THE LAB : Get the blood work     GO TO THE FRONT DESK, PLEASE SCHEDULE YOUR APPOINTMENTS Come back for   a checkup in 6 months

## 2020-03-03 NOTE — Telephone Encounter (Signed)
Cologuard ordered

## 2020-03-03 NOTE — Progress Notes (Signed)
Pre visit review using our clinic review tool, if applicable. No additional management support is needed unless otherwise documented below in the visit note. 

## 2020-03-03 NOTE — Progress Notes (Signed)
Subjective:    Patient ID: Brett Owen, male    DOB: April 18, 1956, 64 y.o.   MRN: [id number]  DOS:  03/03/2020 Type of visit - description: CPX No major concerns   Review of Systems  A 14 point review of systems is negative    Past Medical History:  Diagnosis Date  . Cardiomegaly 12/2017   seen on chest x-ray  . Chronic systolic heart failure ([facility]) 01/15/2018   Nuclear stress test 03/19/18 - EF 28, diaph atten, no ischemia, probable non-ischemic cardiomyopathy; High Risk due to low EF // Echo 01/14/18 - EF 25-30, mild AI, mod MR, mild LAE, mild RV dilation, mild reduced RVSF, mod RAE, small eff // Echo 12/19: EF 55-60, no RWMA, GR 1 DD, GLS -13.6%, mild AI,trivial MR, mild LAE, normal RVSF, PASP 23   . CKD (chronic kidney disease)   . Hypertension     Past Surgical History:  Procedure Laterality Date  . herniated disc surgery  09/1999   lumbar   Family History  Problem Relation Age of Onset  . Colon cancer Mother 86  . Alcohol abuse Father   . Hypertension Paternal Grandfather   . CAD Neg Hx   . Prostate cancer Neg Hx      Allergies as of 03/03/2020   No Known Allergies     Medication List       Accurate as of Mar 03, 2020 11:59 PM. If you have any questions, ask your nurse or doctor.        aspirin EC 81 MG tablet Take 81 mg by mouth daily.   atorvastatin 20 MG tablet Commonly known as: LIPITOR Take 1 tablet (20 mg total) by mouth daily.   carvedilol 6.25 MG tablet Commonly known as: COREG Take 1.5 tablets (9.375 mg total) by mouth 2 (two) times daily with a meal.   Entresto 49-51 MG Generic drug: sacubitril-valsartan Take 1 tablet by mouth twice daily   multivitamin with minerals Tabs tablet Take 1 tablet by mouth daily.          Objective:   Physical Exam BP 134/81 (BP Location: Left Arm, Patient Position: Sitting, Cuff Size: Normal)   Pulse 85   Temp (!) 96.3 F (35.7 C) (Temporal)   Resp 18   Ht 6\' 2"  (1.88 m)   Wt 212 lb (96.2 kg)    SpO2 100%   BMI 27.22 kg/m   General: Well developed, NAD, BMI noted Neck: No  thyromegaly  HEENT:  Normocephalic . Face symmetric, atraumatic Lungs:  CTA B Normal respiratory effort, no intercostal retractions, no accessory muscle use. Heart: RRR,  no murmur.  Abdomen:  Not distended, soft, non-tender. No rebound or rigidity.   Lower extremities: no pretibial edema bilaterally  Skin: Exposed areas without rash. Not pale. Not jaundice DRE: Normal sphincter tone, brown stools, prostate normal. Neurologic:  alert & oriented X3.  Speech normal, gait appropriate for age and unassisted Strength symmetric and appropriate for age.  Psych: Cognition and judgment appear intact.  Cooperative with normal attention span and concentration.  Behavior appropriate. No anxious or depressed appearing.     Assessment     Assessment (new patient 12-2016) HTN new dx 12-2017 CHF new dx  12-2017.  EF 25%, lexiscan 03/2018 (-) ischemia.    PLAN Here for CPX Chronic medical problems: Seem to be stable RTC 6 months    This visit occurred during the SARS-CoV-2 public health emergency.  Safety protocols were in place, including screening  questions prior to the visit, additional usage of staff PPE, and extensive cleaning of exam room while observing appropriate contact time as indicated for disinfecting solutions.

## 2020-03-04 DIAGNOSIS — Z0000 Encounter for general adult medical examination without abnormal findings: Secondary | ICD-10-CM | POA: Insufficient documentation

## 2020-03-04 NOTE — Assessment & Plan Note (Signed)
Here for CPX Chronic medical problems: Seem to be stable RTC 6 months

## 2020-03-04 NOTE — Assessment & Plan Note (Signed)
Due for: Tdap, PNM 23, Shingrix.  Recommend to proceed after Covid vaccinations Covid vaccine: encouraged to proceed  Prostate cancer screening: DRE normal, asymptomatic, check PSA CCS: +FH M age 64. 3 Options discussed, encouraged a colonoscopy, patient declined , elected Cologuard.  Knows to call me if he changes his mind. Labs: BMP, CBC, A1c, TSH, PSA Diet and exercise discussed

## 2020-04-06 LAB — COLOGUARD: Cologuard: NEGATIVE

## 2020-04-13 LAB — COLOGUARD

## 2020-04-13 LAB — EXTERNAL GENERIC LAB PROCEDURE

## 2020-04-27 DIAGNOSIS — Z1211 Encounter for screening for malignant neoplasm of colon: Secondary | ICD-10-CM | POA: Diagnosis not present

## 2020-05-04 LAB — EXTERNAL GENERIC LAB PROCEDURE: COLOGUARD: NEGATIVE

## 2020-05-04 LAB — COLOGUARD: COLOGUARD: NEGATIVE

## 2020-05-05 ENCOUNTER — Encounter: Payer: Self-pay | Admitting: Internal Medicine

## 2020-05-20 ENCOUNTER — Other Ambulatory Visit: Payer: Self-pay | Admitting: Cardiovascular Disease

## 2020-05-25 ENCOUNTER — Ambulatory Visit: Payer: BC Managed Care – PPO | Attending: Internal Medicine

## 2020-05-25 DIAGNOSIS — Z23 Encounter for immunization: Secondary | ICD-10-CM

## 2020-05-25 NOTE — Progress Notes (Signed)
   Covid-19 Vaccination Clinic  Name:  Brett Owen    MRN: [id number] DOB: Oct 30, 1955  05/25/2020  Brett Owen was observed post Covid-19 immunization for 15 minutes without incident. He was provided with Vaccine Information Sheet and instruction to access the V-Safe system.   Brett Owen was instructed to call 911 with any severe reactions post vaccine: [name] Kitchen Difficulty breathing  . Swelling of face and throat  . A fast heartbeat  . A bad rash all over body  . Dizziness and weakness   Immunizations Administered    Name Date Dose VIS Date Route   Pfizer COVID-19 Vaccine 05/25/2020  9:38 AM 0.3 mL 12/10/2018 Intramuscular   Manufacturer: [employer], [employer]   Lot: [id number]   NDC: [address]-9

## 2020-06-15 ENCOUNTER — Ambulatory Visit: Payer: BC Managed Care – PPO | Attending: Internal Medicine

## 2020-06-15 DIAGNOSIS — Z23 Encounter for immunization: Secondary | ICD-10-CM

## 2020-06-15 NOTE — Progress Notes (Signed)
   Covid-19 Vaccination Clinic  Name:  Brett Owen    MRN: [id number] DOB: January 12, 1956  06/15/2020  [name] was observed post Covid-19 immunization for 15 minutes without incident. He was provided with Vaccine Information Sheet and instruction to access the V-Safe system.   [name] was instructed to call 911 with any severe reactions post vaccine: [name] Kitchen Difficulty breathing  . Swelling of face and throat  . A fast heartbeat  . A bad rash all over body  . Dizziness and weakness   Immunizations Administered    Name Date Dose VIS Date Route   Pfizer COVID-19 Vaccine 06/15/2020  9:56 AM 0.3 mL 12/10/2018 Intramuscular   Manufacturer: [employer], [employer]   Lot: [id number]   NDC: [address]-6

## 2020-07-20 ENCOUNTER — Telehealth: Payer: Self-pay

## 2020-07-20 NOTE — Telephone Encounter (Signed)
**Note De-Identified Jaeleah Smyser Obfuscation** I started a Entresto PA through covermymeds: Key: [id number]  Following message received: Eeshan Rahrig Key: [id number]  Outcome  Authorization already on file for this request.  DrugEntresto 49-51MG  tablets  Designer, fashion/clothing PA Form [phone] NCPDP)

## 2020-09-02 ENCOUNTER — Other Ambulatory Visit: Payer: Self-pay

## 2020-09-02 ENCOUNTER — Ambulatory Visit: Payer: BC Managed Care – PPO | Admitting: Internal Medicine

## 2020-09-02 ENCOUNTER — Encounter: Payer: Self-pay | Admitting: Internal Medicine

## 2020-09-02 VITALS — BP 143/87 | HR 71 | Temp 98.1°F | Resp 16 | Ht 74.0 in | Wt 210.5 lb

## 2020-09-02 DIAGNOSIS — I10 Essential (primary) hypertension: Secondary | ICD-10-CM

## 2020-09-02 DIAGNOSIS — I5022 Chronic systolic (congestive) heart failure: Secondary | ICD-10-CM | POA: Diagnosis not present

## 2020-09-02 LAB — COMPREHENSIVE METABOLIC PANEL
ALT: 24 U/L (ref 0–53)
AST: 17 U/L (ref 0–37)
Albumin: 4.4 g/dL (ref 3.5–5.2)
Alkaline Phosphatase: 83 U/L (ref 39–117)
BUN: 17 mg/dL (ref 6–23)
CO2: 33 mEq/L — ABNORMAL HIGH (ref 19–32)
Calcium: 9.4 mg/dL (ref 8.4–10.5)
Chloride: 104 mEq/L (ref 96–112)
Creatinine, Ser: 1.36 mg/dL (ref 0.40–1.50)
GFR: 55.59 mL/min — ABNORMAL LOW (ref >60.00)
Glucose, Bld: 91 mg/dL (ref 70–99)
Potassium: 4.7 mEq/L (ref 3.5–5.1)
Sodium: 141 mEq/L (ref 135–145)
Total Bilirubin: 1.1 mg/dL (ref 0.2–1.2)
Total Protein: 6.5 g/dL (ref 6.0–8.3)

## 2020-09-02 MED ORDER — CARVEDILOL 12.5 MG PO TABS: 12.5000 mg | ORAL_TABLET | Freq: Two times a day (BID) | ORAL | 3 refills | Status: DC

## 2020-09-02 NOTE — Progress Notes (Signed)
Pre visit review using our clinic review tool, if applicable. No additional management support is needed unless otherwise documented below in the visit note. 

## 2020-09-02 NOTE — Patient Instructions (Addendum)
Happy early [place] Ouch!   We are increasing your carvedilol dose slightly.  Continue checking your blood pressures BP GOAL is between 110/65 and  135/85. If it is consistently higher or lower, let me know     GO TO THE LAB : Get the blood work     GO TO THE FRONT DESK, PLEASE SCHEDULE YOUR APPOINTMENTS Come back for a physical exam in May 2022

## 2020-09-02 NOTE — Progress Notes (Signed)
Subjective:    Patient ID: Brett Owen, male    DOB: 1956/01/14, 64 y.o.   MRN: [id number]  DOS:  09/02/2020 Type of visit - description: f/u In general feels well. He would like to get better BPs, currently in the low 140s/80s.    Review of Systems No chest pain no difficulty breathing No lower extremity edema or palpitation Occasionally feels a slightly dizzy briefly when he stands up  Past Medical History:  Diagnosis Date  . Cardiomegaly 12/2017   seen on chest x-ray  . Chronic systolic heart failure ([facility]) 01/15/2018   Nuclear stress test 03/19/18 - EF 28, diaph atten, no ischemia, probable non-ischemic cardiomyopathy; High Risk due to low EF // Echo 01/14/18 - EF 25-30, mild AI, mod MR, mild LAE, mild RV dilation, mild reduced RVSF, mod RAE, small eff // Echo 12/19: EF 55-60, no RWMA, GR 1 DD, GLS -13.6%, mild AI,trivial MR, mild LAE, normal RVSF, PASP 23   . CKD (chronic kidney disease)   . Hypertension     Past Surgical History:  Procedure Laterality Date  . herniated disc surgery  09/1999   lumbar    Allergies as of 09/02/2020   No Known Allergies     Medication List       Accurate as of September 02, 2020  9:24 AM. If you have any questions, ask your nurse or doctor.        aspirin EC 81 MG tablet Take 81 mg by mouth daily.   atorvastatin 20 MG tablet Commonly known as: LIPITOR Take 1 tablet (20 mg total) by mouth daily.   carvedilol 6.25 MG tablet Commonly known as: COREG Take 1.5 tablets (9.375 mg total) by mouth 2 (two) times daily with a meal.   Entresto 49-51 MG Generic drug: sacubitril-valsartan Take 1 tablet by mouth twice daily   multivitamin with minerals Tabs tablet Take 1 tablet by mouth daily.          Objective:   Physical Exam BP (!) 143/87 (BP Location: Left Arm, Patient Position: Sitting, Cuff Size: Normal)   Pulse 71   Temp 98.1 F (36.7 C) (Oral)   Resp 16   Ht 6\' 2"  (1.88 m)   Wt 210 lb 8 oz (95.5 kg)   SpO2 98%    BMI 27.03 kg/m  General:   Well developed, NAD, BMI noted. HEENT:  Normocephalic . Face symmetric, atraumatic Lungs:  CTA B Normal respiratory effort, no intercostal retractions, no accessory muscle use. Heart: RRR,  no murmur.  Lower extremities: no pretibial edema bilaterally  Skin: Not pale. Not jaundice Neurologic:  alert & oriented X3.  Speech normal, gait appropriate for age and unassisted Psych--  Cognition and judgment appear intact.  Cooperative with normal attention span and concentration.  Behavior appropriate. No anxious or depressed appearing.      Assessment      Assessment (new patient 12-2016) HTN new dx 12-2017 CHF new dx  12-2017.  EF 25%, lexiscan 03/2018 (-) ischemia.    PLAN HTN: Doing great with lifestyle, low-salt diet, remains active, see next. CHF: Ambulatory BPs in the 140s/80s, heart rate typically in the 70s.  He would like to see his BP better. Continue Entresto, increase carvedilol from 9.37 twice daily to 12.5 twice daily, monitor BPs. We are referring to cardiology for his non-urgent yearly follow-up.    Check CMP Preventive care discussed: covid vax x 2, rec booster by 11/2020 Declined flu shot Shingrix : d/w pt,  decided to hold off Cologuard was negative. RTC CPX 6 months    This visit occurred during the SARS-CoV-2 public health emergency.  Safety protocols were in place, including screening questions prior to the visit, additional usage of staff PPE, and extensive cleaning of exam room while observing appropriate contact time as indicated for disinfecting solutions.

## 2020-09-03 NOTE — Assessment & Plan Note (Signed)
HTN: Doing great with lifestyle, low-salt diet, remains active, see next. CHF: Ambulatory BPs in the 140s/80s, heart rate typically in the 70s.  He would like to see his BP better. Continue Entresto, increase carvedilol from 9.37 twice daily to 12.5 twice daily, monitor BPs. We are referring to cardiology for his non-urgent yearly follow-up.    Check CMP Preventive care discussed: covid vax x 2, rec booster by 11/2020 Declined flu shot Shingrix : d/w pt, decided to hold off Cologuard was negative. RTC CPX 6 months

## 2020-09-14 NOTE — Progress Notes (Signed)
Virtual Visit via Telephone Note   This visit type was conducted due to national recommendations for restrictions regarding the COVID-19 Pandemic (e.g. social distancing) in an effort to limit this patient's exposure and mitigate transmission in our community.  Due to his co-morbid illnesses, this patient is at least at moderate risk for complications without adequate follow up.  This format is felt to be most appropriate for this patient at this time.  The patient did not have access to video technology/had technical difficulties with video requiring transitioning to audio format only (telephone).  All issues noted in this document were discussed and addressed.  No physical exam could be performed with this format.  Please refer to the patient's chart for his  consent to telehealth for [facility].    Date:  09/15/2020   ID:  Brett Owen, DOB Nov 22, 1955, MRN [id number] The patient was identified using 2 identifiers.  Patient Location: Home Provider Location: Home Office  PCP:  Wanda Plump, MD  Cardiologist:  Kristeen Miss, MD   Electrophysiologist:  None   Evaluation Performed:  Follow-Up Visit  Chief Complaint:  Follow-up (CHF)    Patient Profile: Brett Owen is a 63 y.o. male with:  Heart failure with improved ejection fraction   Echocardiogram 4/19: EF 25-30  Myoview 6/19: no ischemia, c/w non-ischemic cardiomyopathy   Echocardiogram 12/19: EF 55-60   Hypertension   Chronic kidney disease   Mod mitral regurgitation by echocardiogram 4/19  Echocardiogram 12/19: trivial MR  Prior CV Studies: Echo 10/02/18 EF 55-60, no RWMA, Gr 1 DD, GLS -13.6%, mild AI, trivial MR, mild LAE, normal RVSF, PASP 23  Nuclear stress test6/4/19 EF 28, diaph atten, no ischemia, probable non-ischemic cardiomyopathy; High Risk due to low EF  Echo 01/14/18 EF 25-30, mild AI, mod MR, mild LAE, mild RV dilation, mild reduced RVSF, mod RAE, small eff  History of Present Illness:     Brett Owen was last seen by Dr. Elease Hashimoto in 09/2019.  He is seen for f/u.  Overall, he has been doing well.  He recently saw primary care and noted that his blood pressure was running higher.  His carvedilol was increased to 12.5 mg twice daily.  Since that time, his blood pressure has been optimal.  He has not had chest pain, shortness of breath, leg swelling or syncope.  He was getting somewhat dizzy when he would stand up after prolonged sitting.  However, after his carvedilol was increased, his symptoms have resolved   Past Medical History:  Diagnosis Date  . Cardiomegaly 12/2017   seen on chest x-ray  . Chronic systolic heart failure (HCC) 01/15/2018   Nuclear stress test 03/19/18 - EF 28, diaph atten, no ischemia, probable non-ischemic cardiomyopathy; High Risk due to low EF // Echo 01/14/18 - EF 25-30, mild AI, mod MR, mild LAE, mild RV dilation, mild reduced RVSF, mod RAE, small eff // Echo 12/19: EF 55-60, no RWMA, GR 1 DD, GLS -13.6%, mild AI,trivial MR, mild LAE, normal RVSF, PASP 23   . CKD (chronic kidney disease)   . Hypertension    Past Surgical History:  Procedure Laterality Date  . herniated disc surgery  09/1999   lumbar     Current Meds  Medication Sig  . aspirin EC 81 MG tablet Take 81 mg by mouth daily.  [name] Kitchen atorvastatin (LIPITOR) 20 MG tablet Take 1 tablet (20 mg total) by mouth daily.  . carvedilol (COREG) 12.5 MG tablet Take 1 tablet (12.5  mg total) by mouth 2 (two) times daily with a meal.  . ENTRESTO 49-51 MG Take 1 tablet by mouth twice daily  . Multiple Vitamin (MULTIVITAMIN WITH MINERALS) TABS tablet Take 1 tablet by mouth daily.     Allergies:   Patient has no known allergies.   Social History   Tobacco Use  . Smoking status: Never Smoker  . Smokeless tobacco: Never Used  Vaping Use  . Vaping Use: Never used  Substance Use Topics  . Alcohol use: Not Currently    Comment: seasonal, maybe at holidays  . Drug use: Never     Family Hx: The patient's  family history includes Alcohol abuse in his father; Colon cancer (age of onset: 47) in his mother; Hypertension in his paternal grandfather. There is no history of CAD or Prostate cancer.  ROS:   Please see the history of present illness.   Labs/Other Tests and Data Reviewed:    EKG:  No ECG reviewed.  Recent Labs: 03/03/2020: Hemoglobin 15.7; Platelets 224.0; TSH 2.37 09/02/2020: ALT 24; BUN 17; Creatinine, Ser 1.36; Potassium 4.7; Sodium 141   Recent Lipid Panel Lab Results  Component Value Date/Time   CHOL 108 09/23/2019 05:02 PM   TRIG 78 09/23/2019 05:02 PM   HDL 36 (L) 09/23/2019 05:02 PM   CHOLHDL 3.0 09/23/2019 05:02 PM   CHOLHDL 4 09/25/2018 08:32 AM   LDLCALC 56 09/23/2019 05:02 PM    Wt Readings from Last 3 Encounters:  09/15/20 211 lb (95.7 kg)  09/02/20 210 lb 8 oz (95.5 kg)  03/03/20 212 lb (96.2 kg)     Risk Assessment/Calculations:      Objective:    Vital Signs:  BP 120/77   Pulse 70   Ht 6\' 2"  (1.88 m)   Wt 211 lb (95.7 kg)   BMI 27.09 kg/m    VITAL SIGNS:  reviewed GEN:  no acute distress PSYCH:  normal affect  ASSESSMENT & PLAN:    1. HFimpEF (Heart Failure w improved EF) Previous ejection was 25-30%.  EF did improve to 55-60% on medical therapy.  He is NYHA I.  Volume status is stable.  He is currently doing well on carvedilol, Entresto.  His blood pressure is now optimal.  He does not require diuretic therapy.  Continue current medical regimen.  Follow-up 1 year.   Time:   Today, I have spent 13 minutes with the patient with telehealth technology discussing the above problems.     Medication Adjustments/Labs and Tests Ordered: Current medicines are reviewed at length with the patient today.  Concerns regarding medicines are outlined above.   Tests Ordered: No orders of the defined types were placed in this encounter.   Medication Changes: No orders of the defined types were placed in this encounter.   Follow Up:  In Person in 1  year(s)  Signed, , PA-C  09/15/2020 4:37 PM    [facility]

## 2020-09-15 ENCOUNTER — Telehealth: Payer: Self-pay | Admitting: *Deleted

## 2020-09-15 ENCOUNTER — Telehealth (INDEPENDENT_AMBULATORY_CARE_PROVIDER_SITE_OTHER): Payer: BC Managed Care – PPO | Admitting: Physician Assistant

## 2020-09-15 ENCOUNTER — Encounter: Payer: Self-pay | Admitting: Physician Assistant

## 2020-09-15 ENCOUNTER — Other Ambulatory Visit: Payer: Self-pay

## 2020-09-15 VITALS — BP 120/77 | HR 70 | Ht 74.0 in | Wt 211.0 lb

## 2020-09-15 DIAGNOSIS — I5020 Unspecified systolic (congestive) heart failure: Secondary | ICD-10-CM

## 2020-09-15 DIAGNOSIS — I10 Essential (primary) hypertension: Secondary | ICD-10-CM

## 2020-09-15 DIAGNOSIS — N182 Chronic kidney disease, stage 2 (mild): Secondary | ICD-10-CM

## 2020-09-15 DIAGNOSIS — I340 Nonrheumatic mitral (valve) insufficiency: Secondary | ICD-10-CM

## 2020-09-15 NOTE — Telephone Encounter (Signed)
°  Patient Consent for Virtual Visit         Brett Owen has provided verbal consent on 09/15/2020 for a virtual visit (video or telephone).   CONSENT FOR VIRTUAL VISIT FOR:  Brett Owen  By participating in this virtual visit I agree to the following:  I hereby voluntarily request, consent and authorize [facility] and its employed or contracted physicians, physician assistants, nurse practitioners or other licensed health care professionals (the Practitioner), to provide me with telemedicine health care services (the Services") as deemed necessary by the treating Practitioner. I acknowledge and consent to receive the Services by the Practitioner via telemedicine. I understand that the telemedicine visit will involve communicating with the Practitioner through live audiovisual communication technology and the disclosure of certain medical information by electronic transmission. I acknowledge that I have been given the opportunity to request an in-person assessment or other available alternative prior to the telemedicine visit and am voluntarily participating in the telemedicine visit.  I understand that I have the right to withhold or withdraw my consent to the use of telemedicine in the course of my care at any time, without affecting my right to future care or treatment, and that the Practitioner or I may terminate the telemedicine visit at any time. I understand that I have the right to inspect all information obtained and/or recorded in the course of the telemedicine visit and may receive copies of available information for a reasonable fee.  I understand that some of the potential risks of receiving the Services via telemedicine include:   Delay or interruption in medical evaluation due to technological equipment failure or disruption;  Information transmitted may not be sufficient (e.g. poor resolution of images) to allow for appropriate medical decision making by the Practitioner;  and/or   In rare instances, security protocols could fail, causing a breach of personal health information.  Furthermore, I acknowledge that it is my responsibility to provide information about my medical history, conditions and care that is complete and accurate to the best of my ability. I acknowledge that Practitioner's advice, recommendations, and/or decision may be based on factors not within their control, such as incomplete or inaccurate data provided by me or distortions of diagnostic images or specimens that may result from electronic transmissions. I understand that the practice of medicine is not an exact science and that Practitioner makes no warranties or guarantees regarding treatment outcomes. I acknowledge that a copy of this consent can be made available to me via my patient portal [facility] MyChart), or I can request a printed copy by calling the office of [facility].    I understand that my insurance will be billed for this visit.   I have read or had this consent read to me.  I understand the contents of this consent, which adequately explains the benefits and risks of the Services being provided via telemedicine.   I have been provided ample opportunity to ask questions regarding this consent and the Services and have had my questions answered to my satisfaction.  I give my informed consent for the services to be provided through the use of telemedicine in my medical care

## 2020-09-15 NOTE — Patient Instructions (Signed)
Medication Instructions:  Your physician recommends that you continue on your current medications as directed. Please refer to the Current Medication list given to you today.  *If you need a refill on your cardiac medications before your next appointment, please call your pharmacy*  Lab Work: None ordered today  If you have labs (blood work) drawn today and your tests are completely normal, you will receive your results only by: [name] Kitchen MyChart Message (if you have MyChart) OR . A paper copy in the mail If you have any lab test that is abnormal or we need to change your treatment, we will call you to review the results.  Testing/Procedures: None ordered today  Follow-Up: At [facility], you and your health needs are our priority.  As part of our continuing mission to provide you with exceptional heart care, we have created designated Provider Care Teams.  These Care Teams include your primary Cardiologist (physician) and Advanced Practice Providers (APPs -  Physician Assistants and Nurse Practitioners) who all work together to provide you with the care you need, when you need it.  Your next appointment:   12 month(s)  The format for your next appointment:   In Person  Provider:   Kristeen Miss, MD

## 2020-11-18 ENCOUNTER — Other Ambulatory Visit: Payer: Self-pay | Admitting: Cardiovascular Disease

## 2020-12-17 ENCOUNTER — Ambulatory Visit: Payer: BC Managed Care – PPO | Attending: Internal Medicine

## 2020-12-17 DIAGNOSIS — Z23 Encounter for immunization: Secondary | ICD-10-CM

## 2020-12-17 NOTE — Progress Notes (Signed)
   Covid-19 Vaccination Clinic  Name:  Brett Owen    MRN: [id number] DOB: 06-08-1956  12/17/2020  Brett Owen was observed post Covid-19 immunization for 15 minutes without incident. He was provided with Vaccine Information Sheet and instruction to access the V-Safe system.   Brett Owen was instructed to call 911 with any severe reactions post vaccine: [name] Kitchen Difficulty breathing  . Swelling of face and throat  . A fast heartbeat  . A bad rash all over body  . Dizziness and weakness   Immunizations Administered    Name Date Dose VIS Date Route   PFIZER Comrnaty(Gray TOP) Covid-19 Vaccine 12/17/2020  2:02 PM 0.3 mL 09/23/2020 Intramuscular   Manufacturer: [employer], [employer]   Lot: [id number]   NDC: [phone]

## 2021-02-27 ENCOUNTER — Other Ambulatory Visit: Payer: Self-pay | Admitting: Internal Medicine

## 2021-03-03 ENCOUNTER — Encounter: Payer: BC Managed Care – PPO | Admitting: Internal Medicine

## 2021-03-24 ENCOUNTER — Encounter: Payer: BC Managed Care – PPO | Admitting: Internal Medicine

## 2021-05-26 ENCOUNTER — Encounter: Payer: Self-pay | Admitting: Internal Medicine

## 2021-05-26 ENCOUNTER — Ambulatory Visit (INDEPENDENT_AMBULATORY_CARE_PROVIDER_SITE_OTHER): Payer: BC Managed Care – PPO | Admitting: Internal Medicine

## 2021-05-26 ENCOUNTER — Other Ambulatory Visit: Payer: Self-pay

## 2021-05-26 VITALS — BP 126/78 | HR 66 | Temp 98.4°F | Resp 16 | Ht 74.0 in | Wt 215.1 lb

## 2021-05-26 DIAGNOSIS — Z0000 Encounter for general adult medical examination without abnormal findings: Secondary | ICD-10-CM

## 2021-05-26 DIAGNOSIS — I10 Essential (primary) hypertension: Secondary | ICD-10-CM | POA: Diagnosis not present

## 2021-05-26 DIAGNOSIS — Z23 Encounter for immunization: Secondary | ICD-10-CM

## 2021-05-26 LAB — CBC WITH DIFFERENTIAL/PLATELET
Basophils Absolute: 0 10*3/uL (ref 0.0–0.1)
Basophils Relative: 1.3 % (ref 0.0–3.0)
Eosinophils Absolute: 0.1 10*3/uL (ref 0.0–0.7)
Eosinophils Relative: 3.8 % (ref 0.0–5.0)
HCT: 45.7 % (ref 39.0–52.0)
Hemoglobin: 15.3 g/dL (ref 13.0–17.0)
Lymphocytes Relative: 26.3 % (ref 12.0–46.0)
Lymphs Abs: 1 10*3/uL (ref 0.7–4.0)
MCHC: 33.5 g/dL (ref 30.0–36.0)
MCV: 90.9 fl (ref 78.0–100.0)
Monocytes Absolute: 0.5 10*3/uL (ref 0.1–1.0)
Monocytes Relative: 14.2 % — ABNORMAL HIGH (ref 3.0–12.0)
Neutro Abs: 2.1 10*3/uL (ref 1.4–7.7)
Neutrophils Relative %: 54.4 % (ref 43.0–77.0)
Platelets: 221 10*3/uL (ref 150.0–400.0)
RBC: 5.03 Mil/uL (ref 4.22–5.81)
RDW: 12.8 % (ref 11.5–15.5)
WBC: 3.9 10*3/uL — ABNORMAL LOW (ref 4.0–10.5)

## 2021-05-26 LAB — COMPREHENSIVE METABOLIC PANEL
ALT: 18 U/L (ref 0–53)
AST: 15 U/L (ref 0–37)
Albumin: 4.3 g/dL (ref 3.5–5.2)
Alkaline Phosphatase: 92 U/L (ref 39–117)
BUN: 16 mg/dL (ref 6–23)
CO2: 29 mEq/L (ref 19–32)
Calcium: 9.2 mg/dL (ref 8.4–10.5)
Chloride: 104 mEq/L (ref 96–112)
Creatinine, Ser: 1.4 mg/dL (ref 0.40–1.50)
GFR: 53.41 mL/min — ABNORMAL LOW (ref >60.00)
Glucose, Bld: 96 mg/dL (ref 70–99)
Potassium: 4.3 mEq/L (ref 3.5–5.1)
Sodium: 140 mEq/L (ref 135–145)
Total Bilirubin: 1.4 mg/dL — ABNORMAL HIGH (ref 0.2–1.2)
Total Protein: 6.5 g/dL (ref 6.0–8.3)

## 2021-05-26 LAB — LIPID PANEL
Cholesterol: 107 mg/dL (ref 0–200)
HDL: 30.4 mg/dL — ABNORMAL LOW (ref >39.00)
LDL Cholesterol: 53 mg/dL (ref 0–99)
NonHDL: 76.73
Total CHOL/HDL Ratio: 4
Triglycerides: 117 mg/dL (ref 0.0–149.0)
VLDL: 23.4 mg/dL (ref 0.0–40.0)

## 2021-05-26 LAB — PSA: PSA: 2.51 ng/mL (ref 0.10–4.00)

## 2021-05-26 LAB — HEMOGLOBIN A1C: Hgb A1c MFr Bld: 5.8 % (ref 4.6–6.5)

## 2021-05-26 LAB — TSH: TSH: 3.3 u[IU]/mL (ref 0.35–5.50)

## 2021-05-26 NOTE — Progress Notes (Signed)
Subjective:    Patient ID: Brett Owen, male    DOB: 05/18/1956, 65 y.o.   MRN: [id number]  DOS:  05/26/2021 Type of visit - description: CPX  Since the last office visit he is doing well. Essentially asymptomatic.  Review of Systems  A 14 point review of systems is negative    Past Medical History:  Diagnosis Date   Cardiomegaly 12/2017   seen on chest x-ray   Chronic systolic heart failure (HCC) 01/15/2018   Nuclear stress test 03/19/18 - EF 28, diaph atten, no ischemia, probable non-ischemic cardiomyopathy; High Risk due to low EF // Echo 01/14/18 - EF 25-30, mild AI, mod MR, mild LAE, mild RV dilation, mild reduced RVSF, mod RAE, small eff // Echo 12/19: EF 55-60, no RWMA, GR 1 DD, GLS -13.6%, mild AI,trivial MR, mild LAE, normal RVSF, PASP 23    CKD (chronic kidney disease)    Hypertension     Past Surgical History:  Procedure Laterality Date   herniated disc surgery  09/1999   lumbar   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[employer])  Tobacco Use   Smoking status: Never   Smokeless tobacco: Never  Vaping Use   Vaping Use: Never used  Substance and Sexual Activity   Alcohol use: Not Currently    Comment: seasonal, maybe at holidays   Drug use: Never   Sexual activity: Not on file  Other Topics Concern   Not on file  Social History Narrative   Lives by himself   Social Determinants of Health   Financial Resource Strain: Not on file  Food Insecurity: Not on file  Transportation Needs: Not on file  Physical Activity: Not on file  Stress: Not on file  Social Connections: Not on file  Intimate Partner Violence: Not on file    Allergies as of 05/26/2021   No Known Allergies      Medication List        Accurate as of May 26, 2021  11:59 PM. If you have any questions, ask your nurse or doctor.          aspirin EC 81 MG tablet Take 81 mg by mouth daily.   atorvastatin 20 MG tablet Commonly known as: LIPITOR Take 1 tablet (20 mg total) by mouth daily.   carvedilol 12.5 MG tablet Commonly known as: COREG Take 1 tablet (12.5 mg total) by mouth 2 (two) times daily with a meal.   Entresto 49-51 MG Generic drug: sacubitril-valsartan Take 1 tablet by mouth twice daily   multivitamin with minerals Tabs tablet Take 1 tablet by mouth daily.           Objective:   Physical Exam BP 126/78 (BP Location: Left Arm, Patient Position: Sitting, Cuff Size: Normal)   Pulse 66   Temp 98.4 F (36.9 C) (Oral)   Resp 16   Ht 6\' 2"  (1.88 m)   Wt 215 lb 2 oz (97.6 kg)   SpO2 97%   BMI 27.62 kg/m  General: Well developed, NAD, BMI noted Neck: No  thyromegaly  HEENT:  Normocephalic . Face symmetric, atraumatic Lungs:  CTA B Normal respiratory effort, no intercostal retractions, no accessory muscle use. Heart: RRR,  no murmur.  Abdomen:  Not distended, soft, non-tender. No rebound or rigidity.   Lower extremities: no pretibial edema bilaterally  Skin: Exposed areas without rash. Not pale. Not jaundice Neurologic:  alert & oriented X3.  Speech normal, gait appropriate for age and unassisted Strength symmetric and appropriate for age.  Psych: Cognition and judgment appear intact.  Cooperative with normal attention span and concentration.  Behavior appropriate. No anxious or depressed appearing.     Assessment      Assessment (new patient 12-2016) HTN new dx 12-2017 CHF new dx  12-2017.  EF 25%, lexiscan 03/2018 (-) ischemia.    PLAN Here for CPX HF w/ improved EF Saw cardiology virtually 09/15/2020, they noted the EF improved to 55 to 60% on medical therapy.  Next visit 1 year HTN: BP is excellent.  Continue carvedilol, Entresto.   RTC 6 to 8 months   This visit occurred during the SARS-CoV-2 public  health emergency.  Safety protocols were in place, including screening questions prior to the visit, additional usage of staff PPE, and extensive cleaning of exam room while observing appropriate contact time as indicated for disinfecting solutions.

## 2021-05-26 NOTE — Patient Instructions (Addendum)
Proceed with your COVID-vaccine and flu shot this fall  Come back in 2 to 3 months for your second Shingrix  Check the  blood pressure twice a month  BP GOAL is between 110/65 and  135/85. If it is consistently higher or lower, let me know     GO TO THE LAB : Get the blood work     GO TO THE FRONT DESK, PLEASE SCHEDULE YOUR APPOINTMENTS Come back for   your second shingles shot in 2 to 3 months.  Come back for a checkup in 6 to 8 months     "Living will", "Health Care Power of attorney": Advanced care planning  (If you already have a living will or healthcare power of attorney, please bring the copy to be scanned in your chart.)  Advance care planning is a process that supports adults in  understanding and sharing their preferences regarding future medical care.   The patient's preferences are recorded in documents called Advance Directives.    Advanced directives are completed (and can be modified at any time) while the patient is in full mental capacity.   The documentation should be available at all times to the patient, the family and the healthcare providers.  Bring in a copy to be scanned in your chart is an excellent idea and is recommended   This legal documents direct treatment decision making and/or appoint a surrogate to make the decision if the patient is not capable to do so.    Advance directives can be documented in many types of formats,  documents have names such as:  Lliving will  Durable power of attorney for healthcare (healthcare proxy or healthcare power of attorney)  Combined directives  Physician orders for life-sustaining treatment    More information at:  [email or web address]

## 2021-05-27 ENCOUNTER — Encounter: Payer: Self-pay | Admitting: Internal Medicine

## 2021-05-27 NOTE — Assessment & Plan Note (Signed)
Here for CPX HF w/ improved EF Saw cardiology virtually 09/15/2020, they noted the EF improved to 55 to 60% on medical therapy.  Next visit 1 year HTN: BP is excellent.  Continue carvedilol, Entresto.   RTC 6 to 8 months

## 2021-05-27 NOTE — Assessment & Plan Note (Signed)
-  Tdap: Declined - PNM: declined - Shingrix.  First dose today, next in 2 to 3 months -Covid vaccinations x3, recommend booster Prostate cancer screening: DRE normal 2021, PSA 2.9, recheck PSA.  CCS: +FH M age 65.  Cologuard - 03/2020. Labs:  CMP, FLP, CBC, A1c, TSH, PSA Diet and exercise discussed POA: See AVS

## 2021-08-04 ENCOUNTER — Ambulatory Visit (INDEPENDENT_AMBULATORY_CARE_PROVIDER_SITE_OTHER): Payer: BC Managed Care – PPO

## 2021-08-04 ENCOUNTER — Other Ambulatory Visit: Payer: Self-pay

## 2021-08-04 DIAGNOSIS — Z23 Encounter for immunization: Secondary | ICD-10-CM | POA: Diagnosis not present

## 2021-08-22 ENCOUNTER — Other Ambulatory Visit: Payer: Self-pay | Admitting: Internal Medicine

## 2021-08-23 ENCOUNTER — Other Ambulatory Visit: Payer: Self-pay | Admitting: Internal Medicine

## 2021-09-12 ENCOUNTER — Ambulatory Visit: Payer: BC Managed Care – PPO | Admitting: Cardiovascular Disease

## 2021-11-07 ENCOUNTER — Encounter: Payer: Self-pay | Admitting: Cardiovascular Disease

## 2021-11-07 NOTE — Progress Notes (Signed)
Cardiology Office Note:    Date:  11/08/2021   ID:  Owen, Brett 03/19/1956, MRN [id number]  PCP:  Brett Plump, MD  Cardiologist:  Brett Miss, MD   Referring MD: Brett Plump, MD   Problem list 1.  Acute on chronic combined systolic /  Diastolic  congestive heart failure: 2.  Essential hypertension 3.  Renal insufficiency  Chief Complaint  Patient presents with   Congestive Heart Failure         January 15, 2018    Brett Owen is a 66 y.o. male with a hx of hypertension and chronic kidney disease.  He is referred to see me for worsening shortness of breath.  He had an echocardiogram yesterday which showed severe left ventricular dysfunction with an ejection fraction of 25-30%.  He has mild aortic insufficiency, moderate mitral regurgitation.  There was a small pericardial effusion.  9 days ago, started having weight gain 1-2 lbs a week.   Developed swelling in ankles.  Progressed to calves and knees and eventually in the abd. Was not able to eat full meals Has developed shortness of breath,   HR has been 105 - 115  Has developed a dry cough,  Cough is worse with exertion + PND and orthopnea ,  Sleeps in a relciner propped up  15 lb weight gain .  Has not gone to the doctor regularly  Went to the ER and then Brett Owen  In the [facility] room for HTN he was started on amlodipine 5 mg a day and HCTZ 25 mg a day.  He is he saw Brett Owen  several days later.  The HCTZ was discontinued and he was started on Lasix.  Carvedilol 3.125 mg twice a day was started.   The abdominal pressure has resolved slightly .  Has stopped gaining weight. Still has PND and orthopnea  No chest pain, no dizziness, no blurry vision  Tries to limit his salt intake. Has had some CKD  - Creatinine is 1.6   Mother had HTN, maternal grandfather had HTN,   Retired Building control surveyor , now works security  Does not exercise regularly .   April 16, 2 019: Brett Owen seen today for follow-up of  his acute on chronic combined systolic and diastolic congestive heart failure. He has had some episodes of orthostatic hypotension. Breathing is much better, no PND or orthopnea  Ankle edema is much better  HR is much better.  He was tachycardic - now is well controlled   Mar 04, 2018:     Earlie is seen back today for follow-up of his congestive heart failure. Is doing well.   Is walking twice a week.   Is mowing his grass  June 26, 2018: Brett Owen seen today for follow-up of his congestive heart failure. Doing well   September 23, 2019: Brett Owen is seen today for follow-up of his chronic combined systolic and diastolic congestive heart failure.  BP is a bit up a bit.  Has been eating some extra recently  BP at home is 120-130  No CP or dyspnea.   Jan. 24, 2023; Brett Owen is seen today for follow up of his CHF  Wt is 219 lbs  No CP or dyspnea , still very active.  Was working in [employer] ,, now works with [employer] .    Past Medical History:  Diagnosis Date   Cardiomegaly 12/2017   seen on chest x-ray   Chronic systolic heart  failure (HCC) 01/15/2018   Nuclear stress test 03/19/18 - EF 28, diaph atten, no ischemia, probable non-ischemic cardiomyopathy; High Risk due to low EF // Echo 01/14/18 - EF 25-30, mild AI, mod MR, mild LAE, mild RV dilation, mild reduced RVSF, mod RAE, small eff // Echo 12/19: EF 55-60, no RWMA, GR 1 DD, GLS -13.6%, mild AI,trivial MR, mild LAE, normal RVSF, PASP 23    CKD (chronic kidney disease)    Hypertension     Past Surgical History:  Procedure Laterality Date   herniated disc surgery  09/1999   lumbar    Current Medications: Current Meds  Medication Sig   aspirin EC 81 MG tablet Take 81 mg by mouth daily.   atorvastatin (LIPITOR) 20 MG tablet Take 1 tablet by mouth once daily   carvedilol (COREG) 12.5 MG tablet TAKE 1 TABLET BY MOUTH TWICE DAILY WITH MEALS   ENTRESTO 49-51 MG Take 1 tablet by mouth twice daily   Multiple Vitamin (MULTIVITAMIN  WITH MINERALS) TABS tablet Take 1 tablet by mouth daily.     Allergies:   Patient has no known allergies.   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[employer])  Tobacco Use   Smoking status: Never   Smokeless tobacco: Never  Vaping Use   Vaping Use: Never used  Substance and Sexual Activity   Alcohol use: Not Currently    Comment: seasonal, maybe at holidays   Drug use: Never   Sexual activity: Not on file  Other Topics Concern   Not on file  Social History Narrative   Lives by himself   Social Determinants of Health   Financial Resource Strain: Not on file  Food Insecurity: Not on file  Transportation Needs: Not on file  Physical Activity: Not on file  Stress: Not on file  Social Connections: Not on file     Family History: The patient's family history includes Alcohol abuse in his father; Colon cancer (age of onset: 69) in his mother; Hypertension in his paternal grandfather. There is no history of CAD or Prostate cancer.  ROS:   He has had some episodes of orthostatic hypotension..  EKGs/Labs/Other Studies Reviewed:        Recent Labs: 05/26/2021: ALT 18; BUN 16; Creatinine, Ser 1.40; Hemoglobin 15.3; Platelets 221.0; Potassium 4.3; Sodium 140; TSH 3.30  Recent Lipid Panel    Component Value Date/Time   CHOL 107 05/26/2021 1103   CHOL 108 09/23/2019 1702   TRIG 117.0 05/26/2021 1103   HDL 30.40 (L) 05/26/2021 1103   HDL 36 (L) 09/23/2019 1702   CHOLHDL 4 05/26/2021 1103   VLDL 23.4 05/26/2021 1103   LDLCALC 53 05/26/2021 1103   LDLCALC 56 09/23/2019 1702    Physical Exam: Blood pressure 122/82, pulse 80, height 6\' 2"  (1.88 m), weight 219 lb 6.4 oz (99.5 kg), SpO2 99 %.  GEN:  Well nourished, well developed in no acute distress HEENT:  Normal NECK: No JVD; No carotid bruits LYMPHATICS: No lymphadenopathy CARDIAC: RRR , no murmurs, rubs, gallops RESPIRATORY:  Clear to auscultation without rales, wheezing or rhonchi  ABDOMEN: Soft, non-tender, non-distended MUSCULOSKELETAL:  No edema; No deformity  SKIN: Warm and dry NEUROLOGIC:  Alert and oriented x 3  EKG:  November 08, 2021: Normal sinus rhythm at 80.  He has nonspecific ST abnormalities in the inferior leads.  The inferior ST abnormalities are somewhat worse compared to previous EKG in February, 2020.  ASSESSMENT:/ PLAN:      Acute on Chronic combined systolic and diastolic congestive heart failure:  His LV function is now normal.  Continue carvedilol and Entresto.  His heart rate is on the upper limits of where I would want it to be although it still in the normal range.  I have asked him to keep an eye on this.  We could increase his carvedilol slightly if he continues to have mild tachycardia.  2.  Hypertension:    Blood pressure is well controlled.  Continue current medications.  3.  Hyperlipidemia :   His last lipids were from August and his levels look good.  We will check lipids today.  We will also check ALT and basic metabolic profile.  We will have him see Lorin Picket in 1 year for repeat labs.  Medication Adjustments/Labs and Tests Ordered:  Current medicines are reviewed at length with the patient today.  Concerns regarding medicines are outlined above.  Orders Placed This Encounter  Procedures   Basic metabolic panel   Lipid panel   ALT   EKG 12-Lead   No orders of the defined types were placed in this encounter.   Signed, Brett Miss, MD  11/08/2021 9:31 AM    [facility]

## 2021-11-08 ENCOUNTER — Encounter: Payer: Self-pay | Admitting: Cardiovascular Disease

## 2021-11-08 ENCOUNTER — Ambulatory Visit: Payer: BC Managed Care – PPO | Admitting: Cardiovascular Disease

## 2021-11-08 ENCOUNTER — Other Ambulatory Visit: Payer: Self-pay

## 2021-11-08 VITALS — BP 122/82 | HR 80 | Ht 74.0 in | Wt 219.4 lb

## 2021-11-08 DIAGNOSIS — I5020 Unspecified systolic (congestive) heart failure: Secondary | ICD-10-CM

## 2021-11-08 DIAGNOSIS — I5022 Chronic systolic (congestive) heart failure: Secondary | ICD-10-CM | POA: Diagnosis not present

## 2021-11-08 LAB — LIPID PANEL
Chol/HDL Ratio: 3.7 ratio (ref 0.0–5.0)
Cholesterol, Total: 104 mg/dL (ref 100–199)
HDL: 28 mg/dL — ABNORMAL LOW (ref >39)
LDL Chol Calc (NIH): 47 mg/dL (ref 0–99)
Triglycerides: 172 mg/dL — ABNORMAL HIGH (ref 0–149)
VLDL Cholesterol Cal: 29 mg/dL (ref 5–40)

## 2021-11-08 LAB — BASIC METABOLIC PANEL
BUN/Creatinine Ratio: 12 (ref 10–24)
BUN: 16 mg/dL (ref 8–27)
CO2: 27 mmol/L (ref 20–29)
Calcium: 9.3 mg/dL (ref 8.6–10.2)
Chloride: 103 mmol/L (ref 96–106)
Creatinine, Ser: 1.29 mg/dL — ABNORMAL HIGH (ref 0.76–1.27)
Glucose: 101 mg/dL — ABNORMAL HIGH (ref 70–99)
Potassium: 4.2 mmol/L (ref 3.5–5.2)
Sodium: 142 mmol/L (ref 134–144)
eGFR: 62 mL/min/{1.73_m2} (ref >59)

## 2021-11-08 LAB — ALT: ALT: 19 IU/L (ref 0–44)

## 2021-11-08 NOTE — Patient Instructions (Addendum)
Medication Instructions:  Your physician recommends that you continue on your current medications as directed. Please refer to the Current Medication list given to you today.  *If you need a refill on your cardiac medications before your next appointment, please call your pharmacy*   Lab Work: TODAY:  LIPID, ALT, & BMET   If you have labs (blood work) drawn today and your tests are completely normal, you will receive your results only by: [place] (if you have MyChart) OR A paper copy in the mail If you have any lab test that is abnormal or we need to change your treatment, we will call you to review the results.   Testing/Procedures: None ordered   Follow-Up: At [facility], you and your health needs are our priority.  As part of our continuing mission to provide you with exceptional heart care, we have created designated Provider Care Teams.  These Care Teams include your primary Cardiologist (physician) and Advanced Practice Providers (APPs -  Physician Assistants and Nurse Practitioners) who all work together to provide you with the care you need, when you need it.  We recommend signing up for the patient portal called "MyChart".  Sign up information is provided on this After Visit Summary.  MyChart is used to connect with patients for Virtual Visits (Telemedicine).  Patients are able to view lab/test results, encounter notes, upcoming appointments, etc.  Non-urgent messages can be sent to your provider as well.   To learn more about what you can do with MyChart, go to [email or web address].    Your next appointment:   12 month(s)  The format for your next appointment:   In Person  Provider:   Richardson Dopp, PA-C     Other Instructions

## 2021-11-18 ENCOUNTER — Other Ambulatory Visit: Payer: Self-pay | Admitting: Internal Medicine

## 2021-11-18 ENCOUNTER — Other Ambulatory Visit: Payer: Self-pay | Admitting: Cardiovascular Disease

## 2021-12-29 ENCOUNTER — Ambulatory Visit: Payer: BC Managed Care – PPO | Admitting: Internal Medicine

## 2022-02-19 ENCOUNTER — Other Ambulatory Visit: Payer: Self-pay | Admitting: Internal Medicine

## 2022-02-28 ENCOUNTER — Ambulatory Visit (INDEPENDENT_AMBULATORY_CARE_PROVIDER_SITE_OTHER): Payer: BC Managed Care – PPO | Admitting: Internal Medicine

## 2022-02-28 ENCOUNTER — Encounter: Payer: Self-pay | Admitting: Internal Medicine

## 2022-02-28 VITALS — BP 126/74 | HR 70 | Temp 98.1°F | Resp 16 | Ht 74.0 in | Wt 216.0 lb

## 2022-02-28 DIAGNOSIS — R739 Hyperglycemia, unspecified: Secondary | ICD-10-CM

## 2022-02-28 DIAGNOSIS — Z23 Encounter for immunization: Secondary | ICD-10-CM

## 2022-02-28 DIAGNOSIS — I5022 Chronic systolic (congestive) heart failure: Secondary | ICD-10-CM

## 2022-02-28 LAB — BASIC METABOLIC PANEL
BUN: 22 mg/dL (ref 6–23)
CO2: 28 mEq/L (ref 19–32)
Calcium: 9.2 mg/dL (ref 8.4–10.5)
Chloride: 104 mEq/L (ref 96–112)
Creatinine, Ser: 1.34 mg/dL (ref 0.40–1.50)
GFR: 56 mL/min — ABNORMAL LOW (ref >60.00)
Glucose, Bld: 103 mg/dL — ABNORMAL HIGH (ref 70–99)
Potassium: 4.4 mEq/L (ref 3.5–5.1)
Sodium: 139 mEq/L (ref 135–145)

## 2022-02-28 LAB — CBC WITH DIFFERENTIAL/PLATELET
Basophils Absolute: 0 10*3/uL (ref 0.0–0.1)
Basophils Relative: 1.1 % (ref 0.0–3.0)
Eosinophils Absolute: 0.2 10*3/uL (ref 0.0–0.7)
Eosinophils Relative: 4.6 % (ref 0.0–5.0)
HCT: 47.8 % (ref 39.0–52.0)
Hemoglobin: 15.9 g/dL (ref 13.0–17.0)
Lymphocytes Relative: 24.9 % (ref 12.0–46.0)
Lymphs Abs: 0.9 10*3/uL (ref 0.7–4.0)
MCHC: 33.3 g/dL (ref 30.0–36.0)
MCV: 91.5 fl (ref 78.0–100.0)
Monocytes Absolute: 0.6 10*3/uL (ref 0.1–1.0)
Monocytes Relative: 14.6 % — ABNORMAL HIGH (ref 3.0–12.0)
Neutro Abs: 2.1 10*3/uL (ref 1.4–7.7)
Neutrophils Relative %: 54.8 % (ref 43.0–77.0)
Platelets: 208 10*3/uL (ref 150.0–400.0)
RBC: 5.22 Mil/uL (ref 4.22–5.81)
RDW: 12.7 % (ref 11.5–15.5)
WBC: 3.8 10*3/uL — ABNORMAL LOW (ref 4.0–10.5)

## 2022-02-28 LAB — HEMOGLOBIN A1C: Hgb A1c MFr Bld: 5.8 % (ref 4.6–6.5)

## 2022-02-28 NOTE — Assessment & Plan Note (Signed)
Hyperglycemia: A1c was 5.8, minimal hyperglycemia, patient is very concerned about it, we had extensive discussion about eating healthy, low carbohydrates, lean proteins, abundant fruits , etc ?Recheck A1c ?HTN: Well-controlled ?CHF ?Saw cardiology 10-2021, they noted his EF to be significantly improved. ?Heart rate was a little in the high side at the time but is normal now.  Ambulatory heart rate in the 60s, 70s. ?Was rx to RTC 1 year ?Continue Entresto, carvedilol, check a BMP and CBC ?Tdap today ?RTC 3 to 4 months CPX ?

## 2022-02-28 NOTE — Patient Instructions (Addendum)
Recommend to proceed with covid booster (bivalent) at your pharmacy. ? ?Check the  blood pressure regularly ?BP GOAL is between 110/65 and  135/85. ?If it is consistently higher or lower, let me know ? ?  ? ? ?GO TO THE LAB : Get the blood work   ? ? ?GO TO THE FRONT DESK, PLEASE SCHEDULE YOUR APPOINTMENTS ?Come back for a physical exam in 3 to 4 months ?

## 2022-02-28 NOTE — Progress Notes (Signed)
? ?  Subjective:  ? ? Patient ID: Brett Owen, male    DOB: 1956-05-03, 66 y.o.   MRN: [id number] ? ?DOS:  02/28/2022 ?Type of visit - description: f/u ? ?Since the last office visit is doing well. ?Saw cardiology, report reviewed. ?History of CHF, denies chest pain, difficulty breathing, palpitations. ?He is concerned about A1c of 5.8. ? ?Review of Systems ?See above  ? ?Past Medical History:  ?Diagnosis Date  ? Cardiomegaly 12/2017  ? seen on chest x-ray  ? Chronic systolic heart failure ([place]) 01/15/2018  ? Nuclear stress test 03/19/18 - EF 28, diaph atten, no ischemia, probable non-ischemic cardiomyopathy; High Risk due to low EF // Echo 01/14/18 - EF 25-30, mild AI, mod MR, mild LAE, mild RV dilation, mild reduced RVSF, mod RAE, small eff // Echo 12/19: EF 55-60, no RWMA, GR 1 DD, GLS -13.6%, mild AI,trivial MR, mild LAE, normal RVSF, PASP 23   ? CKD (chronic kidney disease)   ? Hypertension   ? ? ?Past Surgical History:  ?Procedure Laterality Date  ? herniated disc surgery  09/1999  ? lumbar  ? ? ?Current Outpatient Medications  ?Medication Instructions  ? aspirin EC 81 mg, Oral, Daily  ? atorvastatin (LIPITOR) 20 MG tablet Take 1 tablet by mouth once daily  ? carvedilol (COREG) 12.5 MG tablet TAKE 1 TABLET BY MOUTH TWICE DAILY WITH MEALS  ? ENTRESTO 49-51 MG Take 1 tablet by mouth twice daily  ? Multiple Vitamin (MULTIVITAMIN WITH MINERALS) TABS tablet 1 tablet, Oral, Daily  ? ? ?   ?Objective:  ? Physical Exam ?BP 126/74 (BP Location: Left Arm, Patient Position: Sitting, Cuff Size: Normal)   Pulse 70   Temp 98.1 ?F (36.7 ?C) (Oral)   Resp 16   Ht 6\' 2"  (1.88 m)   Wt 216 lb (98 kg)   SpO2 97%   BMI 27.73 kg/m?  ?General:   ?Well developed, NAD, BMI noted. ?HEENT:  ?Normocephalic . Face symmetric, atraumatic ?Lungs:  ?CTA B ?Normal respiratory effort, no intercostal retractions, no accessory muscle use. ?Heart: RRR,  no murmur.  ?Lower extremities: no pretibial edema bilaterally  ?Skin: Not pale. Not  jaundice ?Neurologic:  ?alert & oriented X3.  ?Speech normal, gait appropriate for age and unassisted ?Psych--  ?Cognition and judgment appear intact.  ?Cooperative with normal attention span and concentration.  ?Behavior appropriate. ?No anxious or depressed appearing.  ? ?   ?Assessment   ?Assessment (new patient 12-2016) ?A1c: 5.8 ?HTN new dx 12-2017 ?CHF new dx  12-2017.  EF 25%, lexiscan 03/2018 (-) ischemia.   ? ?PLAN ?Hyperglycemia: A1c was 5.8, minimal hyperglycemia, patient is very concerned about it, we had extensive discussion about eating healthy, low carbohydrates, lean proteins, abundant fruits , etc ?Recheck A1c ?HTN: Well-controlled ?CHF ?Saw cardiology 10-2021, they noted his EF to be significantly improved. ?Heart rate was a little in the high side at the time but is normal now.  Ambulatory heart rate in the 60s, 70s. ?Was rx to RTC 1 year ?Continue Entresto, carvedilol, check a BMP and CBC ?Tdap today ?RTC 3 to 4 months CPX ?  ?

## 2022-05-15 ENCOUNTER — Other Ambulatory Visit: Payer: Self-pay | Admitting: Internal Medicine

## 2022-05-30 ENCOUNTER — Other Ambulatory Visit: Payer: Self-pay | Admitting: Internal Medicine

## 2022-07-11 ENCOUNTER — Encounter: Payer: BC Managed Care – PPO | Admitting: Internal Medicine

## 2022-11-27 ENCOUNTER — Ambulatory Visit: Payer: Medicare Other | Admitting: Internal Medicine

## 2022-11-29 ENCOUNTER — Telehealth: Payer: Self-pay | Admitting: Cardiovascular Disease

## 2022-11-29 MED ORDER — ENTRESTO 49-51 MG PO TABS: 1.0000 | ORAL_TABLET | Freq: Two times a day (BID) | ORAL | 0 refills | Status: DC

## 2022-11-29 NOTE — Telephone Encounter (Signed)
*  STAT* If patient is at the pharmacy, call can be transferred to refill team.   1. Which medications need to be refilled? (please list name of each medication and dose if known)  ENTRESTO 49-51 MG  2. Which pharmacy/location (including street and city if local pharmacy) is medication to be sent to? [id number] - [place], [place] - [address]  3. Do they need a 30 day or 90 day supply?  30 day supply

## 2022-11-29 NOTE — Telephone Encounter (Signed)
Pt's medication was sent to pt's pharmacy as requested. Confirmation received.  °

## 2022-12-04 ENCOUNTER — Encounter: Payer: Self-pay | Admitting: Internal Medicine

## 2022-12-04 ENCOUNTER — Ambulatory Visit (INDEPENDENT_AMBULATORY_CARE_PROVIDER_SITE_OTHER): Payer: Medicare Other | Admitting: Internal Medicine

## 2022-12-04 VITALS — BP 136/88 | HR 54 | Temp 98.2°F | Resp 18 | Ht 74.0 in | Wt 214.0 lb

## 2022-12-04 DIAGNOSIS — E785 Hyperlipidemia, unspecified: Secondary | ICD-10-CM

## 2022-12-04 DIAGNOSIS — Z125 Encounter for screening for malignant neoplasm of prostate: Secondary | ICD-10-CM | POA: Diagnosis not present

## 2022-12-04 DIAGNOSIS — I10 Essential (primary) hypertension: Secondary | ICD-10-CM | POA: Diagnosis not present

## 2022-12-04 DIAGNOSIS — I5022 Chronic systolic (congestive) heart failure: Secondary | ICD-10-CM

## 2022-12-04 DIAGNOSIS — R739 Hyperglycemia, unspecified: Secondary | ICD-10-CM | POA: Diagnosis not present

## 2022-12-04 LAB — COMPREHENSIVE METABOLIC PANEL
ALT: 16 U/L (ref 0–53)
AST: 12 U/L (ref 0–37)
Albumin: 4.3 g/dL (ref 3.5–5.2)
Alkaline Phosphatase: 82 U/L (ref 39–117)
BUN: 20 mg/dL (ref 6–23)
CO2: 29 mEq/L (ref 19–32)
Calcium: 9.6 mg/dL (ref 8.4–10.5)
Chloride: 104 mEq/L (ref 96–112)
Creatinine, Ser: 1.39 mg/dL (ref 0.40–1.50)
GFR: 53.3 mL/min — ABNORMAL LOW (ref >60.00)
Glucose, Bld: 73 mg/dL (ref 70–99)
Potassium: 4.2 mEq/L (ref 3.5–5.1)
Sodium: 140 mEq/L (ref 135–145)
Total Bilirubin: 1.3 mg/dL — ABNORMAL HIGH (ref 0.2–1.2)
Total Protein: 6.7 g/dL (ref 6.0–8.3)

## 2022-12-04 LAB — CBC WITH DIFFERENTIAL/PLATELET
Basophils Absolute: 0 10*3/uL (ref 0.0–0.1)
Basophils Relative: 1.3 % (ref 0.0–3.0)
Eosinophils Absolute: 0.1 10*3/uL (ref 0.0–0.7)
Eosinophils Relative: 2.6 % (ref 0.0–5.0)
HCT: 49.8 % (ref 39.0–52.0)
Hemoglobin: 16.8 g/dL (ref 13.0–17.0)
Lymphocytes Relative: 24.9 % (ref 12.0–46.0)
Lymphs Abs: 0.9 10*3/uL (ref 0.7–4.0)
MCHC: 33.7 g/dL (ref 30.0–36.0)
MCV: 90.6 fl (ref 78.0–100.0)
Monocytes Absolute: 0.6 10*3/uL (ref 0.1–1.0)
Monocytes Relative: 16.1 % — ABNORMAL HIGH (ref 3.0–12.0)
Neutro Abs: 2 10*3/uL (ref 1.4–7.7)
Neutrophils Relative %: 55.1 % (ref 43.0–77.0)
Platelets: 222 10*3/uL (ref 150.0–400.0)
RBC: 5.5 Mil/uL (ref 4.22–5.81)
RDW: 12.9 % (ref 11.5–15.5)
WBC: 3.6 10*3/uL — ABNORMAL LOW (ref 4.0–10.5)

## 2022-12-04 LAB — PSA: PSA: 2.61 ng/mL (ref 0.10–4.00)

## 2022-12-04 LAB — HEMOGLOBIN A1C: Hgb A1c MFr Bld: 6.1 % (ref 4.6–6.5)

## 2022-12-04 MED ORDER — ATORVASTATIN CALCIUM 20 MG PO TABS: 20.0000 mg | ORAL_TABLET | Freq: Every day | ORAL | 1 refills | Status: DC

## 2022-12-04 MED ORDER — CARVEDILOL 12.5 MG PO TABS: 12.5000 mg | ORAL_TABLET | Freq: Two times a day (BID) | ORAL | 3 refills | Status: DC

## 2022-12-04 NOTE — Patient Instructions (Addendum)
Vaccines I recommend:  Covid booster Flu shot RSV vaccine  Check the  blood pressure regularly BP GOAL is between 110/65 and  135/85. If it is consistently higher or lower, let me know      GO TO THE LAB : Get the blood work     [place], [place] back for a checkup in 6 months    "Lewis of attorney" ,  "Living will" (Advance care planning documents)  If you already have a living will or healthcare power of attorney, is recommended you bring the copy to be scanned in your chart.   The document will be available to all the doctors you see in the system.  Advance care planning is a process that supports adults in  understanding and sharing their preferences regarding future medical care.  The patient's preferences are recorded in documents called Advance Directives and the can be modified at any time while the patient is in full mental capacity.   If you don't have one, please consider create one.      More information at: [email or web address]

## 2022-12-04 NOTE — Progress Notes (Unsigned)
   Subjective:    Patient ID: Brett Owen, male    DOB: 05-25-1956, 67 y.o.   MRN: [id number]  DOS:  12/04/2022 Type of visit - description: Follow-up  Since the last OV he is feeling well. Ran out of some medications few days ago. Denies any chest pain or difficulty breathing No nausea vomiting No lower extremity edema No LUTS  Review of Systems See above   Past Medical History:  Diagnosis Date   Cardiomegaly 12/2017   seen on chest x-ray   Chronic systolic heart failure ([place]) 01/15/2018   Nuclear stress test 03/19/18 - EF 28, diaph atten, no ischemia, probable non-ischemic cardiomyopathy; High Risk due to low EF // Echo 01/14/18 - EF 25-30, mild AI, mod MR, mild LAE, mild RV dilation, mild reduced RVSF, mod RAE, small eff // Echo 12/19: EF 55-60, no RWMA, GR 1 DD, GLS -13.6%, mild AI,trivial MR, mild LAE, normal RVSF, PASP 23    CKD (chronic kidney disease)    Hypertension     Past Surgical History:  Procedure Laterality Date   herniated disc surgery  09/1999   lumbar    Current Outpatient Medications  Medication Instructions   aspirin EC 81 mg, Oral, Daily   atorvastatin (LIPITOR) 20 mg, Oral, Daily   carvedilol (COREG) 12.5 mg, Oral, 2 times daily with meals   Multiple Vitamin (MULTIVITAMIN WITH MINERALS) TABS tablet 1 tablet, Oral, Daily   sacubitril-valsartan (ENTRESTO) 49-51 MG 1 tablet, Oral, 2 times daily       Objective:   Physical Exam BP 136/88   Pulse (!) 54   Temp 98.2 F (36.8 C) (Oral)   Resp 18   Ht 6' 2"$  (1.88 m)   Wt 214 lb (97.1 kg)   SpO2 97%   BMI 27.48 kg/m  General: Well developed, NAD, BMI noted Neck: No  thyromegaly  HEENT:  Normocephalic . Face symmetric, atraumatic Lungs:  CTA B Normal respiratory effort, no intercostal retractions, no accessory muscle use. Heart: RRR,  no murmur.  Abdomen:  Not distended, soft, non-tender. No rebound or rigidity.   Lower extremities: no pretibial edema bilaterally DRE: Declined Skin:  Exposed areas without rash. Not pale. Not jaundice Neurologic:  alert & oriented X3.  Speech normal, gait appropriate for age and unassisted Strength symmetric and appropriate for age.  Psych: Cognition and judgment appear intact.  Cooperative with normal attention span and concentration.  Behavior appropriate. No anxious or depressed appearing.     Assessment     Assessment (new patient 12-2016) A1c: 5.8 HTN new dx 12-2017 High cholesterol CHF new dx  12-2017.  EF 25%, lexiscan 03/2018 (-) ischemia.    PLAN ROV Hyperglycemia: Check A1c HTN: Ran out of carvedilol several days ago, RF sent.  Ran out of Entresto temporarily, cardiology already sent a refill.  Encouraged to take medications consistently, checking labs. Dyslipidemia: On Lipitor, RF sent, last FLP satisfactory CHF: Seems to be doing well, recommend to see cardiology yearly. Preventive care reviewed RTC 6 months

## 2022-12-05 DIAGNOSIS — E785 Hyperlipidemia, unspecified: Secondary | ICD-10-CM | POA: Insufficient documentation

## 2022-12-05 NOTE — Assessment & Plan Note (Signed)
ROV Hyperglycemia: Check A1c HTN: Ran out of carvedilol several days ago, RF sent.  Ran out of Entresto temporarily, cardiology already sent a refill.  Encouraged to take medications consistently, checking labs. Dyslipidemia: On Lipitor, RF sent, last FLP satisfactory CHF: Seems to be doing well, recommend to see cardiology yearly. Preventive care reviewed RTC 6 months

## 2022-12-05 NOTE — Assessment & Plan Note (Signed)
-   Tdap: 2023 - PNM 20, RSV: recommended, pt stets will think about it - s/p Shingrix x2 - covid vax d/w pt   - flu shot has declined consistently -Prostate cancer screening: Declines DRE, check PSA.  - CCS: +FH M age 67.  Cologuard - 03/2020.  Next 03/2023, recommend to think about colonoscopy instead of repeated Cologuard  Healthcare POA: See AVS

## 2022-12-11 ENCOUNTER — Encounter: Payer: Self-pay | Admitting: Cardiovascular Disease

## 2022-12-11 NOTE — Progress Notes (Unsigned)
Cardiology Office Note:    Date:  12/12/2022   ID:  Kennis, Naegele February 06, 1956, MRN [id number]  PCP:  Colon Branch, MD  Cardiologist:  Mertie Moores, MD   Referring MD: Colon Branch, MD   Problem list 1.  Acute on chronic combined systolic /  Diastolic  congestive heart failure: 2.  Essential hypertension 3.  Renal insufficiency  Chief Complaint  Patient presents with   Congestive Heart Failure    January 15, 2018    Brett Owen is a 67 y.o. male with a hx of hypertension and chronic kidney disease.  He is referred to see me for worsening shortness of breath.  He had an echocardiogram yesterday which showed severe left ventricular dysfunction with an ejection fraction of 25-30%.  He has mild aortic insufficiency, moderate mitral regurgitation.  There was a small pericardial effusion.  9 days ago, started having weight gain 1-2 lbs a week.   Developed swelling in ankles.  Progressed to calves and knees and eventually in the abd. Was not able to eat full meals Has developed shortness of breath,   HR has been 105 - 115  Has developed a dry cough,  Cough is worse with exertion + PND and orthopnea ,  Sleeps in a relciner propped up  15 lb weight gain .  Has not gone to the doctor regularly  Went to the ER and then Dr. Larose Kells  In the [facility] for HTN he was started on amlodipine 5 mg a day and HCTZ 25 mg a day.  He is he saw Dr. Larose Kells  several days later.  The HCTZ was discontinued and he was started on Lasix.  Carvedilol 3.125 mg twice a day was started.   The abdominal pressure has resolved slightly .  Has stopped gaining weight. Still has PND and orthopnea  No chest pain, no dizziness, no blurry vision  Tries to limit his salt intake. Has had some CKD  - Creatinine is 1.6   Mother had HTN, maternal grandfather had HTN,   Retired Art gallery manager , now works security  Does not exercise regularly .   April 16, 2 019: Brett Owen seen today for follow-up of his  acute on chronic combined systolic and diastolic congestive heart failure. He has had some episodes of orthostatic hypotension. Breathing is much better, no PND or orthopnea  Ankle edema is much better  HR is much better.  He was tachycardic - now is well controlled   Mar 04, 2018:     Brett Owen is seen back today for follow-up of his congestive heart failure. Is doing well.   Is walking twice a week.   Is mowing his grass  June 26, 2018: Brett Owen seen today for follow-up of his congestive heart failure. Doing well   September 23, 2019: Brett Owen is seen today for follow-up of his chronic combined systolic and diastolic congestive heart failure.  BP is a bit up a bit.  Has been eating some extra recently  BP at home is 120-130  No CP or dyspnea.   Jan. 24, 2023; Brett Owen is seen today for follow up of his CHF  Wt is 219 lbs  No CP or dyspnea , still very active.  Was working in [employer] ,, now works with [employer] .  Feb. 27, 2024 Brett Owen is seen for further evaluation and management of his CHF Wt is 213 lbs ( down 6 lbs from last year)  EF has  improved on coreg 12.5, entresto 49-51 BID,  Tries to avoid salt and salty food  Walks regularly  Still working with [employer]    Past Medical History:  Diagnosis Date   Cardiomegaly 12/2017   seen on chest x-ray   Chronic systolic heart failure ([place]) 01/15/2018   Nuclear stress test 03/19/18 - EF 28, diaph atten, no ischemia, probable non-ischemic cardiomyopathy; High Risk due to low EF // Echo 01/14/18 - EF 25-30, mild AI, mod MR, mild LAE, mild RV dilation, mild reduced RVSF, mod RAE, small eff // Echo 12/19: EF 55-60, no RWMA, GR 1 DD, GLS -13.6%, mild AI,trivial MR, mild LAE, normal RVSF, PASP 23    CKD (chronic kidney disease)    Hypertension     Past Surgical History:  Procedure Laterality Date   herniated disc surgery  09/1999   lumbar    Current Medications: Current Meds  Medication Sig   aspirin EC 81 MG tablet Take 81 mg by  mouth daily.   atorvastatin (LIPITOR) 20 MG tablet Take 1 tablet (20 mg total) by mouth daily.   carvedilol (COREG) 12.5 MG tablet Take 1 tablet (12.5 mg total) by mouth 2 (two) times daily with a meal.   Multiple Vitamin (MULTIVITAMIN WITH MINERALS) TABS tablet Take 1 tablet by mouth daily.   [DISCONTINUED] sacubitril-valsartan (ENTRESTO) 49-51 MG Take 1 tablet by mouth 2 (two) times daily.     Allergies:   Patient has no known allergies.   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[employer])  Tobacco Use   Smoking status: Never   Smokeless tobacco: Never  Vaping Use   Vaping Use: Never used  Substance and Sexual Activity   Alcohol use: Not Currently    Comment: seasonal, maybe at holidays   Drug use: Never   Sexual activity: Not on file  Other Topics Concern   Not on file  Social History Narrative   Lives by himself   Social Determinants of Health   Financial Resource Strain: Not on file  Food Insecurity: Not on file  Transportation Needs: Not on file  Physical Activity: Not on file  Stress: Not on file  Social Connections: Not on file     Family History: The patient's family history includes Alcohol abuse in his father; Colon cancer (age of onset: 15) in his mother; Hypertension in his paternal grandfather. There is no history of CAD or Prostate cancer.  ROS:   He has had some episodes of orthostatic hypotension..  EKGs/Labs/Other Studies Reviewed:        Recent Labs: 12/04/2022: ALT 16; BUN 20; Creatinine, Ser 1.39; Hemoglobin 16.8; Platelets 222.0; Potassium 4.2; Sodium 140  Recent Lipid Panel    Component Value Date/Time   CHOL 104 11/08/2021 0933   TRIG 172 (H) 11/08/2021 0933   HDL 28 (L) 11/08/2021 0933   CHOLHDL 3.7 11/08/2021 0933    CHOLHDL 4 05/26/2021 1103   VLDL 23.4 05/26/2021 1103   LDLCALC 47 11/08/2021 0933    Physical Exam: Blood pressure 132/82, pulse 77, height '6\' 2"'$  (1.88 m), weight 213 lb 6.4 oz (96.8 kg), SpO2 96 %.       GEN:  Well nourished, well developed in no acute distress HEENT: Normal NECK:  No JVD; No carotid bruits LYMPHATICS: No lymphadenopathy CARDIAC: RRR , no murmurs, rubs, gallops RESPIRATORY:  Clear to auscultation without rales, wheezing or rhonchi  ABDOMEN: Soft, non-tender, non-distended MUSCULOSKELETAL:  No edema; No deformity  SKIN: Warm and dry NEUROLOGIC:  Alert and oriented x 3   EKG: December 12, 2022: Normal sinus rhythm at 77.  Normal EKG.  ASSESSMENT:/ PLAN:      Acute on Chronic combined systolic and diastolic congestive heart failure:   Continue Entresto, carvedilol.    2.  Hypertension:   Blood pressure is well-controlled.    3.  Hyperlipidemia : Triglyceride levels been mildly elevated.  Eat cereal for most mornings.  I encouraged him to work on eating an egg white omelette or similar.  He could also try eating more high fiber cereal.    Medication Adjustments/Labs and Tests Ordered:  Current medicines are reviewed at length with the patient today.  Concerns regarding medicines are outlined above.  Orders Placed This Encounter  Procedures   Basic metabolic panel   ALT   Lipid panel   EKG 12-Lead   Meds ordered this encounter  Medications   sacubitril-valsartan (ENTRESTO) 49-51 MG    Sig: Take 1 tablet by mouth 2 (two) times daily.    Dispense:  180 tablet    Refill:  3    Signed, Mertie Moores, MD  12/12/2022 9:31 AM    [place]

## 2022-12-12 ENCOUNTER — Encounter: Payer: Self-pay | Admitting: Cardiovascular Disease

## 2022-12-12 ENCOUNTER — Ambulatory Visit: Payer: Medicare Other | Attending: Cardiovascular Disease | Admitting: Cardiovascular Disease

## 2022-12-12 VITALS — BP 132/82 | HR 77 | Ht 74.0 in | Wt 213.4 lb

## 2022-12-12 DIAGNOSIS — I10 Essential (primary) hypertension: Secondary | ICD-10-CM | POA: Diagnosis present

## 2022-12-12 DIAGNOSIS — I5042 Chronic combined systolic (congestive) and diastolic (congestive) heart failure: Secondary | ICD-10-CM | POA: Insufficient documentation

## 2022-12-12 DIAGNOSIS — E782 Mixed hyperlipidemia: Secondary | ICD-10-CM | POA: Insufficient documentation

## 2022-12-12 MED ORDER — ENTRESTO 49-51 MG PO TABS: 1.0000 | ORAL_TABLET | Freq: Two times a day (BID) | ORAL | 3 refills | Status: DC

## 2022-12-12 NOTE — Patient Instructions (Signed)
Medication Instructions:  Your physician recommends that you continue on your current medications as directed. Please refer to the Current Medication list given to you today.  *If you need a refill on your cardiac medications before your next appointment, please call your pharmacy*   Lab Work: Lipids, ALT, BMET today If you have labs (blood work) drawn today and your tests are completely normal, you will receive your results only by: [place] (if you have MyChart) OR A paper copy in the mail If you have any lab test that is abnormal or we need to change your treatment, we will call you to review the results.   Testing/Procedures: NONE   Follow-Up: At [facility], you and your health needs are our priority.  As part of our continuing mission to provide you with exceptional heart care, we have created designated Provider Care Teams.  These Care Teams include your primary Cardiologist (physician) and Advanced Practice Providers (APPs -  Physician Assistants and Nurse Practitioners) who all work together to provide you with the care you need, when you need it.  We recommend signing up for the patient portal called "MyChart".  Sign up information is provided on this After Visit Summary.  MyChart is used to connect with patients for Virtual Visits (Telemedicine).  Patients are able to view lab/test results, encounter notes, upcoming appointments, etc.  Non-urgent messages can be sent to your provider as well.   To learn more about what you can do with MyChart, go to [email or web address].    Your next appointment:   6 month(s)  Provider:   Christen Bame, NP     Then, Mertie Moores, MD will plan to see you again in 1 year(s).

## 2022-12-13 LAB — ALT: ALT: 21 IU/L (ref 0–44)

## 2022-12-13 LAB — BASIC METABOLIC PANEL
BUN/Creatinine Ratio: 13 (ref 10–24)
BUN: 16 mg/dL (ref 8–27)
CO2: 24 mmol/L (ref 20–29)
Calcium: 9.2 mg/dL (ref 8.6–10.2)
Chloride: 107 mmol/L — ABNORMAL HIGH (ref 96–106)
Creatinine, Ser: 1.21 mg/dL (ref 0.76–1.27)
Glucose: 64 mg/dL — ABNORMAL LOW (ref 70–99)
Potassium: 4.3 mmol/L (ref 3.5–5.2)
Sodium: 144 mmol/L (ref 134–144)
eGFR: 66 mL/min/{1.73_m2} (ref >59)

## 2022-12-13 LAB — LIPID PANEL
Chol/HDL Ratio: 3.3 ratio (ref 0.0–5.0)
Cholesterol, Total: 105 mg/dL (ref 100–199)
HDL: 32 mg/dL — ABNORMAL LOW (ref >39)
LDL Chol Calc (NIH): 47 mg/dL (ref 0–99)
Triglycerides: 151 mg/dL — ABNORMAL HIGH (ref 0–149)
VLDL Cholesterol Cal: 26 mg/dL (ref 5–40)

## 2023-04-20 ENCOUNTER — Encounter: Payer: Self-pay | Admitting: Internal Medicine

## 2023-05-16 ENCOUNTER — Encounter (INDEPENDENT_AMBULATORY_CARE_PROVIDER_SITE_OTHER): Payer: Self-pay

## 2023-05-31 ENCOUNTER — Other Ambulatory Visit: Payer: Self-pay | Admitting: Internal Medicine

## 2023-06-04 ENCOUNTER — Ambulatory Visit: Payer: Medicare Other | Admitting: Internal Medicine

## 2023-07-24 ENCOUNTER — Encounter: Payer: Self-pay | Admitting: Internal Medicine

## 2023-07-24 ENCOUNTER — Ambulatory Visit (INDEPENDENT_AMBULATORY_CARE_PROVIDER_SITE_OTHER): Payer: Medicare Other | Admitting: Internal Medicine

## 2023-07-24 ENCOUNTER — Ambulatory Visit (HOSPITAL_BASED_OUTPATIENT_CLINIC_OR_DEPARTMENT_OTHER)
Admission: RE | Admit: 2023-07-24 | Discharge: 2023-07-24 | Disposition: A | Discharge status: Home / Self Care | Payer: Medicare Other | Source: Ambulatory Visit | Attending: Internal Medicine | Admitting: Internal Medicine

## 2023-07-24 VITALS — BP 128/80 | HR 83 | Temp 98.1°F | Resp 16 | Ht 74.0 in | Wt 203.2 lb

## 2023-07-24 DIAGNOSIS — N5089 Other specified disorders of the male genital organs: Secondary | ICD-10-CM

## 2023-07-24 DIAGNOSIS — D4959 Neoplasm of unspecified behavior of other genitourinary organ: Secondary | ICD-10-CM | POA: Diagnosis not present

## 2023-07-24 NOTE — Patient Instructions (Addendum)
Go to the first floor, get a ultrasound of your scrotum.  Later today, come back to the office, we will need a urine sample. We might need also some blood work.

## 2023-07-24 NOTE — Progress Notes (Signed)
Subjective:    Patient ID: Brett Owen, male    DOB: [birth year], 67 y.o.   MRN: [id number]  DOS:  07/24/2023 Type of visit - description: acute  3 weeks history of gradual increase size of the left scrotum. He denies scrotal pain although he has some discomfort going from the left groin upwards.   No fever or chills. No injury. No gross hematuria, dysuria or penile discharge.  No recent sexual activity. Question of mild weight loss.  Question of night sweats. No abdominal pain per se, no nausea vomiting.  Wt Readings from Last 3 Encounters:  07/24/23 203 lb 4 oz (92.2 kg)  12/12/22 213 lb 6.4 oz (96.8 kg)  12/04/22 214 lb (97.1 kg)      Review of Systems See above   Past Medical History:  Diagnosis Date   Cardiomegaly 12/2017   seen on chest x-ray   Chronic systolic heart failure ([facility]) 01/15/2018   Nuclear stress test 03/19/18 - EF 28, diaph atten, no ischemia, probable non-ischemic cardiomyopathy; High Risk due to low EF // Echo 01/14/18 - EF 25-30, mild AI, mod MR, mild LAE, mild RV dilation, mild reduced RVSF, mod RAE, small eff // Echo 12/19: EF 55-60, no RWMA, GR 1 DD, GLS -13.6%, mild AI,trivial MR, mild LAE, normal RVSF, PASP 23    CKD (chronic kidney disease)    Hypertension     Past Surgical History:  Procedure Laterality Date   herniated disc surgery  09/1999   lumbar    Current Outpatient Medications  Medication Instructions   aspirin EC 81 mg, Oral, Daily   atorvastatin (LIPITOR) 20 mg, Oral, Daily   carvedilol (COREG) 12.5 mg, Oral, 2 times daily with meals   Multiple Vitamin (MULTIVITAMIN WITH MINERALS) TABS tablet 1 tablet, Oral, Daily   sacubitril-valsartan (ENTRESTO) 49-51 MG 1 tablet, Oral, 2 times daily       Objective:   Physical Exam BP 128/80   Pulse 83   Temp 98.1 F (36.7 C) (Oral)   Resp 16   Ht 6\' 2"  (1.88 m)   Wt 203 lb 4 oz (92.2 kg)   SpO2 98%   BMI 26.10 kg/m  General:   Well developed, NAD, BMI noted.  HEENT:   Normocephalic . Face symmetric, atraumatic  Abdomen:  Not distended, soft, non-tender. No rebound or rigidity. GU: Penis: Normal with no discharge or rash. Scrotum: Palpable, right-sided, 3 cm, soft nontender testicle. The left side of the scrotum is the size of a orange approximately, not tender, not warm, unable to palpate the testicle there.  Tense fluctuance noted.  skin: Not pale. Not jaundice Lower extremities: no pretibial edema bilaterally  Neurologic:  alert & oriented X3.  Speech normal, gait appropriate for age and unassisted Psych--  Cognition and judgment appear intact.  Cooperative with normal attention span and concentration.  Behavior appropriate. No anxious or depressed appearing.     Assessment     Assessment (new patient 12-2016) A1c: 5.8 HTN new dx 12-2017 High cholesterol CHF new dx  12-2017.  EF 25%, lexiscan 03/2018 (-) ischemia.    PLAN L scrotum swelling: Stat ultrasound of the scrotum, UA urine culture, further advised for results. Addendum: Ultrasound suspicious of testicular neoplasm.  I spoke with the patient, he is at work, support provided. Plan: Refer to urology, health clinic they will be able to see him soon.  No need to pursue a UA urine culture.

## 2023-07-24 NOTE — Assessment & Plan Note (Signed)
L scrotum swelling: Stat ultrasound of the scrotum, UA urine culture, further advised for results. Addendum: Ultrasound suspicious of testicular neoplasm.  I spoke with the patient, he is at work, support provided. Plan: Refer to urology, health clinic they will be able to see him soon.  No need to pursue a UA urine culture.

## 2023-07-25 ENCOUNTER — Ambulatory Visit (INDEPENDENT_AMBULATORY_CARE_PROVIDER_SITE_OTHER): Payer: Medicare Other | Admitting: Urology

## 2023-07-25 ENCOUNTER — Ambulatory Visit: Payer: Self-pay | Admitting: Urology

## 2023-07-25 ENCOUNTER — Encounter: Payer: Self-pay | Admitting: Urology

## 2023-07-25 VITALS — BP 130/86 | HR 86 | Ht 74.0 in | Wt 203.0 lb

## 2023-07-25 DIAGNOSIS — N433 Hydrocele, unspecified: Secondary | ICD-10-CM | POA: Diagnosis not present

## 2023-07-25 DIAGNOSIS — N5089 Other specified disorders of the male genital organs: Secondary | ICD-10-CM | POA: Diagnosis not present

## 2023-07-25 NOTE — Progress Notes (Signed)
Assessment: 1. Testicular mass   2. Hydrocele, unspecified hydrocele type      Plan: Today I had a long discussion with the patient regarding his left testicular mass as well as a large left hydrocele.  I discussed with him the differential diagnoses including benign and malignant histologies and recommendations for left inguinal orchiectomy.  Nature procedure discussed in detail today including potential adverse events and complications.  Patient agrees to proceed.  Will obtain preop labs today including CBC, CMP, LDH, AFP, and beta-hCG  Chief Complaint: Testicular mass  History of Present Illness:  Brett Owen is a 67 y.o. male who is seen in consultation from Wanda Plump, MD for evaluation of left testicular mass. Patient reports that he has noted something not right over the last couple months involving his left hemiscrotum with some slight enlargement which increased relative slowly significantly over the past 3 to 4 weeks.  He denies any pain except for some achy sensations near his left inguinal region.  There is no history of trauma or constitutional complaints except for some mild weight loss over the past 6 months.    Scrotal ultrasound 07/24/2023-- IMPRESSION: 1. Ill-defined hypoenhancing lesion of the inferior pole of the left testicle measuring 2.3 x 2.0 x 1.4 cm. This is concerning for a testicular neoplasm. Recommend Urology consultation. 2. Small right, large left hydroceles. 3. No evidence of testicular torsion.    Past Medical History:  Past Medical History:  Diagnosis Date   Cardiomegaly 12/2017   seen on chest x-ray   Chronic systolic heart failure ([facility]) 01/15/2018   Nuclear stress test 03/19/18 - EF 28, diaph atten, no ischemia, probable non-ischemic cardiomyopathy; High Risk due to low EF // Echo 01/14/18 - EF 25-30, mild AI, mod MR, mild LAE, mild RV dilation, mild reduced RVSF, mod RAE, small eff // Echo 12/19: EF 55-60, no RWMA, GR 1 DD, GLS -13.6%, mild  AI,trivial MR, mild LAE, normal RVSF, PASP 23    CKD (chronic kidney disease)    Hypertension     Past Surgical History:  Past Surgical History:  Procedure Laterality Date   herniated disc surgery  09/1999   lumbar    Allergies:  No Known Allergies  Family History:  Family History  Problem Relation Age of Onset   Colon cancer Mother 50   Alcohol abuse Father    Hypertension Paternal Grandfather    CAD Neg Hx    Prostate cancer Neg Hx     Social History:  Social History   Tobacco Use   Smoking status: Never   Smokeless tobacco: Never  Vaping Use   Vaping status: Never Used  Substance Use Topics   Alcohol use: Not Currently    Comment: seasonal, maybe at holidays   Drug use: Never    Review of symptoms:  Constitutional:  Negative for unexplained weight loss, night sweats, fever, chills ENT:  Negative for nose bleeds, sinus pain, painful swallowing CV:  Negative for chest pain, shortness of breath, exercise intolerance, palpitations, loss of consciousness Resp:  Negative for cough, wheezing, shortness of breath GI:  Negative for nausea, vomiting, diarrhea, bloody stools GU:  Positives noted in HPI; otherwise negative for gross hematuria, dysuria, urinary incontinence Neuro:  Negative for seizures, poor balance, limb weakness, slurred speech Psych:  Negative for lack of energy, depression, anxiety Endocrine:  Negative for polydipsia, polyuria, symptoms of hypoglycemia (dizziness, hunger, sweating) Hematologic:  Negative for anemia, purpura, petechia, prolonged or excessive bleeding, use of  anticoagulants  Allergic:  Negative for difficulty breathing or choking as a result of exposure to anything; no shellfish allergy; no allergic response (rash/itch) to materials, foods  Physical exam: BP 130/86   Pulse 86   Ht 6\' 2"  (1.88 m)   Wt 203 lb (92.1 kg)   BMI 26.06 kg/m  GENERAL APPEARANCE:  Well appearing, well developed, well nourished, NAD HEENT: Atraumatic,  Normocephalic, oropharynx clear. LUNGS: Clear to auscultation bilaterally. HEART: Regular Rate and Rhythm without murmurs, gallops, or rubs. ABDOMEN: Soft, non-tender, No Masses. EXTREMITIES: Moves all extremities well.  Without clubbing, cyanosis, or edema. NEUROLOGIC:  Alert and oriented x 3, normal gait, CN II-XII grossly intact.  MENTAL STATUS:  Appropriate. SKIN:  Warm, dry and intact.

## 2023-07-25 NOTE — H&P (View-Only) (Signed)
Assessment: 1. Testicular mass   2. Hydrocele, unspecified hydrocele type      Plan: Today I had a long discussion with the patient regarding his left testicular mass as well as a large left hydrocele.  I discussed with him the differential diagnoses including benign and malignant histologies and recommendations for left inguinal orchiectomy.  Nature procedure discussed in detail today including potential adverse events and complications.  Patient agrees to proceed.  Will obtain preop labs today including CBC, CMP, LDH, AFP, and beta-hCG  Chief Complaint: Testicular mass  History of Present Illness:  Brett Owen is a 67 y.o. male who is seen in consultation from Wanda Plump, MD for evaluation of left testicular mass. Patient reports that he has noted something not right over the last couple months involving his left hemiscrotum with some slight enlargement which increased relative slowly significantly over the past 3 to 4 weeks.  He denies any pain except for some achy sensations near his left inguinal region.  There is no history of trauma or constitutional complaints except for some mild weight loss over the past 6 months.    Scrotal ultrasound 07/24/2023-- IMPRESSION: 1. Ill-defined hypoenhancing lesion of the inferior pole of the left testicle measuring 2.3 x 2.0 x 1.4 cm. This is concerning for a testicular neoplasm. Recommend Urology consultation. 2. Small right, large left hydroceles. 3. No evidence of testicular torsion.    Past Medical History:  Past Medical History:  Diagnosis Date   Cardiomegaly 12/2017   seen on chest x-ray   Chronic systolic heart failure ([facility]) 01/15/2018   Nuclear stress test 03/19/18 - EF 28, diaph atten, no ischemia, probable non-ischemic cardiomyopathy; High Risk due to low EF // Echo 01/14/18 - EF 25-30, mild AI, mod MR, mild LAE, mild RV dilation, mild reduced RVSF, mod RAE, small eff // Echo 12/19: EF 55-60, no RWMA, GR 1 DD, GLS -13.6%, mild  AI,trivial MR, mild LAE, normal RVSF, PASP 23    CKD (chronic kidney disease)    Hypertension     Past Surgical History:  Past Surgical History:  Procedure Laterality Date   herniated disc surgery  09/1999   lumbar    Allergies:  No Known Allergies  Family History:  Family History  Problem Relation Age of Onset   Colon cancer Mother 68   Alcohol abuse Father    Hypertension Paternal Grandfather    CAD Neg Hx    Prostate cancer Neg Hx     Social History:  Social History   Tobacco Use   Smoking status: Never   Smokeless tobacco: Never  Vaping Use   Vaping status: Never Used  Substance Use Topics   Alcohol use: Not Currently    Comment: seasonal, maybe at holidays   Drug use: Never    Review of symptoms:  Constitutional:  Negative for unexplained weight loss, night sweats, fever, chills ENT:  Negative for nose bleeds, sinus pain, painful swallowing CV:  Negative for chest pain, shortness of breath, exercise intolerance, palpitations, loss of consciousness Resp:  Negative for cough, wheezing, shortness of breath GI:  Negative for nausea, vomiting, diarrhea, bloody stools GU:  Positives noted in HPI; otherwise negative for gross hematuria, dysuria, urinary incontinence Neuro:  Negative for seizures, poor balance, limb weakness, slurred speech Psych:  Negative for lack of energy, depression, anxiety Endocrine:  Negative for polydipsia, polyuria, symptoms of hypoglycemia (dizziness, hunger, sweating) Hematologic:  Negative for anemia, purpura, petechia, prolonged or excessive bleeding, use of  anticoagulants  Allergic:  Negative for difficulty breathing or choking as a result of exposure to anything; no shellfish allergy; no allergic response (rash/itch) to materials, foods  Physical exam: BP 130/86   Pulse 86   Ht 6\' 2"  (1.88 m)   Wt 203 lb (92.1 kg)   BMI 26.06 kg/m  GENERAL APPEARANCE:  Well appearing, well developed, well nourished, NAD HEENT: Atraumatic,  Normocephalic, oropharynx clear. LUNGS: Clear to auscultation bilaterally. HEART: Regular Rate and Rhythm without murmurs, gallops, or rubs. ABDOMEN: Soft, non-tender, No Masses. EXTREMITIES: Moves all extremities well.  Without clubbing, cyanosis, or edema. NEUROLOGIC:  Alert and oriented x 3, normal gait, CN II-XII grossly intact.  MENTAL STATUS:  Appropriate. SKIN:  Warm, dry and intact.

## 2023-07-26 LAB — AFP TUMOR MARKER: AFP, Serum, Tumor Marker: 2.9 ng/mL (ref 0.0–8.4)

## 2023-07-26 LAB — URINALYSIS, ROUTINE W REFLEX MICROSCOPIC
Bilirubin, UA: NEGATIVE
Glucose, UA: NEGATIVE
Leukocytes,UA: NEGATIVE
Nitrite, UA: NEGATIVE
Protein,UA: NEGATIVE
Specific Gravity, UA: 1.025 (ref 1.005–1.030)
Urobilinogen, Ur: 0.2 mg/dL (ref 0.2–1.0)
pH, UA: 5.5 (ref 5.0–7.5)

## 2023-07-26 LAB — COMPREHENSIVE METABOLIC PANEL
ALT: 25 [IU]/L (ref 0–44)
AST: 18 [IU]/L (ref 0–40)
Albumin: 3.9 g/dL (ref 3.9–4.9)
Alkaline Phosphatase: 107 [IU]/L (ref 44–121)
BUN/Creatinine Ratio: 15 (ref 10–24)
BUN: 19 mg/dL (ref 8–27)
Bilirubin Total: 0.9 mg/dL (ref 0.0–1.2)
CO2: 22 mmol/L (ref 20–29)
Calcium: 9 mg/dL (ref 8.6–10.2)
Chloride: 103 mmol/L (ref 96–106)
Creatinine, Ser: 1.27 mg/dL (ref 0.76–1.27)
Globulin, Total: 2.2 g/dL (ref 1.5–4.5)
Glucose: 72 mg/dL (ref 70–99)
Potassium: 4.6 mmol/L (ref 3.5–5.2)
Sodium: 142 mmol/L (ref 134–144)
Total Protein: 6.1 g/dL (ref 6.0–8.5)
eGFR: 63 mL/min/{1.73_m2} (ref >59)

## 2023-07-26 LAB — MICROSCOPIC EXAMINATION
Epithelial Cells (non renal): NONE SEEN /[HPF] (ref 0–10)
WBC, UA: NONE SEEN /[HPF] (ref 0–5)

## 2023-07-26 LAB — CBC WITH DIFFERENTIAL/PLATELET
Basophils Absolute: 0.1 10*3/uL (ref 0.0–0.2)
Basos: 1 %
EOS (ABSOLUTE): 0.1 10*3/uL (ref 0.0–0.4)
Eos: 2 %
Hematocrit: 46.9 % (ref 37.5–51.0)
Hemoglobin: 15.3 g/dL (ref 13.0–17.7)
Immature Grans (Abs): 0 10*3/uL (ref 0.0–0.1)
Immature Granulocytes: 0 %
Lymphocytes Absolute: 0.7 10*3/uL (ref 0.7–3.1)
Lymphs: 13 %
MCH: 30.3 pg (ref 26.6–33.0)
MCHC: 32.6 g/dL (ref 31.5–35.7)
MCV: 93 fL (ref 79–97)
Monocytes Absolute: 0.7 10*3/uL (ref 0.1–0.9)
Monocytes: 12 %
Neutrophils Absolute: 4 10*3/uL (ref 1.4–7.0)
Neutrophils: 72 %
Platelets: 323 10*3/uL (ref 150–450)
RBC: 5.05 x10E6/uL (ref 4.14–5.80)
RDW: 11.6 % (ref 11.6–15.4)
WBC: 5.6 10*3/uL (ref 3.4–10.8)

## 2023-07-26 LAB — BETA HCG QUANT (REF LAB): hCG Quant: <1 m[IU]/mL (ref 0–3)

## 2023-07-26 LAB — LACTATE DEHYDROGENASE: LDH: 173 [IU]/L (ref 121–224)

## 2023-07-27 ENCOUNTER — Encounter (HOSPITAL_COMMUNITY)
Admission: RE | Admit: 2023-07-27 | Discharge: 2023-07-27 | Disposition: A | Discharge status: Home / Self Care | Payer: Medicare Other | Source: Ambulatory Visit | Attending: Urology | Admitting: Urology

## 2023-07-27 ENCOUNTER — Other Ambulatory Visit: Payer: Self-pay

## 2023-07-27 ENCOUNTER — Encounter (HOSPITAL_COMMUNITY): Payer: Self-pay

## 2023-07-27 NOTE — Patient Instructions (Addendum)
SURGICAL WAITING ROOM VISITATION  Patients having surgery or a procedure may have no more than 2 support people in the waiting area - these visitors may rotate.    Children under the age of 14 must have an adult with them who is not the patient.  Due to an increase in RSV and influenza rates and associated hospitalizations, children ages 14 and under may not visit patients in [facility] hospitals.  If the patient needs to stay at the hospital during part of their recovery, the visitor guidelines for inpatient rooms apply. Pre-op nurse will coordinate an appropriate time for 1 support person to accompany patient in pre-op.  This support person may not rotate.    Please refer to the [facility] website for the visitor guidelines for Inpatients (after your surgery is over and you are in a regular room).       Your procedure is scheduled on:  07/31/2023    Report to [facility] Main Entrance    Report to admitting at   1130AM   Call this number if you have problems the morning of surgery [phone]   Do not eat food or drink liquids  :After Midnight.                               If you have questions, please contact your surgeon's office.       Oral Hygiene is also important to reduce your risk of infection.                                    Remember - BRUSH YOUR TEETH THE MORNING OF SURGERY WITH YOUR REGULAR TOOTHPASTE  DENTURES WILL BE REMOVED PRIOR TO SURGERY PLEASE DO NOT APPLY "Poly grip" OR ADHESIVES!!!   Do NOT smoke after Midnight   Stop all vitamins and herbal supplements 7 days before surgery.    Take these medicines the morning of surgery with A SIP OF WATER:  coreg   DO NOT TAKE ANY ORAL DIABETIC MEDICATIONS DAY OF YOUR SURGERY  Bring CPAP mask and tubing day of surgery.                              You may not have any metal on your body including hair pins, jewelry, and body piercing             Do not wear make-up, lotions, powders,  perfumes/cologne, or deodorant  Do not wear nail polish including gel and S&S, artificial/acrylic nails, or any other type of covering on natural nails including finger and toenails. If you have artificial nails, gel coating, etc. that needs to be removed by a nail salon please have this removed prior to surgery or surgery may need to be canceled/ delayed if the surgeon/ anesthesia feels like they are unable to be safely monitored.   Do not shave  48 hours prior to surgery.               Men may shave face and neck.   Do not bring valuables to the hospital. [facility] IS NOT             RESPONSIBLE   FOR VALUABLES.   Contacts, glasses, dentures or bridgework may not be worn into surgery.   Bring small overnight bag  day of surgery.   DO NOT BRING YOUR HOME MEDICATIONS TO THE HOSPITAL. PHARMACY WILL DISPENSE MEDICATIONS LISTED ON YOUR MEDICATION LIST TO YOU DURING YOUR ADMISSION IN THE HOSPITAL!    Patients discharged on the day of surgery will not be allowed to drive home.  Someone NEEDS to stay with you for the first 24 hours after anesthesia.   Special Instructions: Bring a copy of your healthcare power of attorney and living will documents the day of surgery if you haven't scanned them before.              Please read over the following fact sheets you were given: IF YOU HAVE QUESTIONS ABOUT YOUR PRE-OP INSTRUCTIONS PLEASE CALL [phone]   If you received a COVID test during your pre-op visit  it is requested that you wear a mask when out in public, stay away from anyone that may not be feeling well and notify your surgeon if you develop symptoms. If you test positive for Covid or have been in contact with anyone that has tested positive in the last 10 days please notify you surgeon.    [facility] - Preparing for Surgery Before surgery, you can play an important role.  Because skin is not sterile, your skin needs to be as free of germs as possible.  You can reduce the number of  germs on your skin by washing with CHG (chlorahexidine gluconate) soap before surgery.  CHG is an antiseptic cleaner which kills germs and bonds with the skin to continue killing germs even after washing. Please DO NOT use if you have an allergy to CHG or antibacterial soaps.  If your skin becomes reddened/irritated stop using the CHG and inform your nurse when you arrive at Short Stay. Do not shave (including legs and underarms) for at least 48 hours prior to the first CHG shower.  You may shave your face/neck. Please follow these instructions carefully:  1.  Shower with CHG Soap the night before surgery and the  morning of Surgery.  2.  If you choose to wash your hair, wash your hair first as usual with your  normal  shampoo.  3.  After you shampoo, rinse your hair and body thoroughly to remove the  shampoo.                           4.  Use CHG as you would any other liquid soap.  You can apply chg directly  to the skin and wash                       Gently with a scrungie or clean washcloth.  5.  Apply the CHG Soap to your body ONLY FROM THE NECK DOWN.   Do not use on face/ open                           Wound or open sores. Avoid contact with eyes, ears mouth and genitals (private parts).                       Wash face,  Genitals (private parts) with your normal soap.             6.  Wash thoroughly, paying special attention to the area where your surgery  will be performed.  7.  Thoroughly rinse your body with warm water from  the neck down.  8.  DO NOT shower/wash with your normal soap after using and rinsing off  the CHG Soap.                9.  Pat yourself dry with a clean towel.            10.  Wear clean pajamas.            11.  Place clean sheets on your bed the night of your first shower and do not  sleep with pets. Day of Surgery : Do not apply any lotions/deodorants the morning of surgery.  Please wear clean clothes to the hospital/surgery center.  FAILURE TO FOLLOW THESE  INSTRUCTIONS MAY RESULT IN THE CANCELLATION OF YOUR SURGERY PATIENT SIGNATURE_________________________________  NURSE SIGNATURE__________________________________  ________________________________________________________________________

## 2023-07-27 NOTE — Progress Notes (Addendum)
Anesthesia Review:  PCP: Jose PAZ LOV 07/24/23  Cardiologist : Nahser- LVO 12/12/22  Chest x-ray : EKG : 12/12/22  Echo : 2019  Stress test: 2019  Cardiac Cath :  Activity level:  Sleep Study/ CPAP : Fasting Blood Sugar :      / Checks Blood Sugar -- times a day:   Blood Thinner/ Instructions /Last Dose: ASCBC/Diff and CMP done 07/25/23- epic A / Instructions/ Last Dose :    CBC/DIFF and CMP done 07/25/2023 - epic   Telephone appt- completed med hx and preop instructons on 07/27/23.  PT aware of Dial soap shower nite before and am of surgery.  Pt voiced understanding.  PT aware to call Admitting once phone call complete at [phone] or [phone].

## 2023-07-30 NOTE — Progress Notes (Signed)
Anesthesia Chart Review   Case: [id number] Date/Time: 07/31/23 1315   Procedure: ORCHIECTOMY (Left)   Anesthesia type: General   Pre-op diagnosis: left testis tumor with large hydrocele   Location: [facility] ROOM 01 / [facility]   Surgeons: Joline Maxcy, MD       DISCUSSION:67 y.o. never smoker with h/o HTN, CHF, left testis tumor with large hydrocele scheduled for above procedure 07/31/2023 with Dr. Marcha Solders.   Pt last seen by cardiology 12/12/2022. Per OV note EF has improved on Coreg and Entresto.  Stable at this visit, no changes made. Echo 10/02/2018 with EF 55-60%.   VS: There were no vitals taken for this visit.  PROVIDERS: Wanda Plump, MD is PCP   Kristeen Miss, MD is Cardiologist  LABS: Labs reviewed: Acceptable for surgery. (all labs ordered are listed, but only abnormal results are displayed)  Labs Reviewed - No data to display   IMAGES:   EKG:   CV: Echo 10/02/2018 - Left ventricle: The cavity size was normal. Wall thickness was    normal. Systolic function was normal. The estimated ejection    fraction was in the range of 55% to 60%. Wall motion was normal;    there were no regional wall motion abnormalities. Doppler    parameters are consistent with abnormal left ventricular    relaxation (grade 1 diastolic dysfunction). GLS -13.6%, abnormal.  - Aortic valve: There was no stenosis. There was mild    regurgitation.  - Mitral valve: There was trivial regurgitation.  - Left atrium: The atrium was mildly dilated.  - Right ventricle: The cavity size was normal. Systolic function    was normal.  - Tricuspid valve: Peak RV-RA gradient (S): 20 mm Hg.  - Pulmonary arteries: PA peak pressure: 23 mm Hg (S).  - Inferior vena cava: The vessel was normal in size. The    respirophasic diameter changes were in the normal range (>= 50%),    consistent with normal central venous pressure.   Myocardial Perfusion 03/19/2018 Nuclear stress EF: 28%. Blood pressure  demonstrated a hypertensive response to exercise. There was no ST segment deviation noted during stress. This is a high risk study. The left ventricular ejection fraction is severely decreased (<30%).   1. EF 28%, diffuse hypokinesis.  2. Fixed small, mild basal inferior perfusion defect.  Most likely diaphragmatic attenuation.  No ischemia.    Suspect nonischemic cardiomyopathy.  High risk study due to low EF.  Past Medical History:  Diagnosis Date   Cardiomegaly 12/2017   seen on chest x-ray   CHF (congestive heart failure) (HCC)    Chronic systolic heart failure (HCC) 01/15/2018   Nuclear stress test 03/19/18 - EF 28, diaph atten, no ischemia, probable non-ischemic cardiomyopathy; High Risk due to low EF // Echo 01/14/18 - EF 25-30, mild AI, mod MR, mild LAE, mild RV dilation, mild reduced RVSF, mod RAE, small eff // Echo 12/19: EF 55-60, no RWMA, GR 1 DD, GLS -13.6%, mild AI,trivial MR, mild LAE, normal RVSF, PASP 23    Hypertension     Past Surgical History:  Procedure Laterality Date   herniated disc surgery  09/1999   lumbar    MEDICATIONS:  aspirin EC 81 MG tablet   atorvastatin (LIPITOR) 20 MG tablet   carvedilol (COREG) 12.5 MG tablet   Multiple Vitamin (MULTIVITAMIN WITH MINERALS) TABS tablet   sacubitril-valsartan (ENTRESTO) 49-51 MG   No current facility-administered medications for this encounter.    Jodell Cipro Ward,  PA-C [facility] Pre-Surgical Testing [phone]

## 2023-07-31 ENCOUNTER — Encounter (HOSPITAL_COMMUNITY): Payer: Self-pay | Admitting: Urology

## 2023-07-31 ENCOUNTER — Ambulatory Visit (HOSPITAL_COMMUNITY): Payer: Medicare Other | Admitting: Physician Assistant

## 2023-07-31 ENCOUNTER — Encounter (HOSPITAL_COMMUNITY)
Admission: RE | Disposition: A | Discharge status: Home / Self Care | Payer: Self-pay | Source: Ambulatory Visit | Attending: Urology

## 2023-07-31 ENCOUNTER — Ambulatory Visit (HOSPITAL_BASED_OUTPATIENT_CLINIC_OR_DEPARTMENT_OTHER): Payer: Medicare Other | Admitting: Certified Registered"

## 2023-07-31 ENCOUNTER — Other Ambulatory Visit: Payer: Self-pay

## 2023-07-31 ENCOUNTER — Ambulatory Visit (HOSPITAL_COMMUNITY)
Admission: RE | Admit: 2023-07-31 | Discharge: 2023-07-31 | Disposition: A | Discharge status: Home / Self Care | Payer: Medicare Other | Source: Ambulatory Visit | Attending: Urology | Admitting: Urology

## 2023-07-31 DIAGNOSIS — N433 Hydrocele, unspecified: Secondary | ICD-10-CM | POA: Diagnosis not present

## 2023-07-31 DIAGNOSIS — I10 Essential (primary) hypertension: Secondary | ICD-10-CM | POA: Diagnosis not present

## 2023-07-31 DIAGNOSIS — I130 Hypertensive heart and chronic kidney disease with heart failure and stage 1 through stage 4 chronic kidney disease, or unspecified chronic kidney disease: Secondary | ICD-10-CM | POA: Insufficient documentation

## 2023-07-31 DIAGNOSIS — N501 Vascular disorders of male genital organs: Secondary | ICD-10-CM | POA: Insufficient documentation

## 2023-07-31 DIAGNOSIS — N5089 Other specified disorders of the male genital organs: Secondary | ICD-10-CM

## 2023-07-31 DIAGNOSIS — D4959 Neoplasm of unspecified behavior of other genitourinary organ: Secondary | ICD-10-CM | POA: Diagnosis not present

## 2023-07-31 DIAGNOSIS — N189 Chronic kidney disease, unspecified: Secondary | ICD-10-CM | POA: Diagnosis not present

## 2023-07-31 DIAGNOSIS — Z8249 Family history of ischemic heart disease and other diseases of the circulatory system: Secondary | ICD-10-CM | POA: Insufficient documentation

## 2023-07-31 DIAGNOSIS — I5022 Chronic systolic (congestive) heart failure: Secondary | ICD-10-CM | POA: Insufficient documentation

## 2023-07-31 HISTORY — PX: ORCHIECTOMY: SHX2116

## 2023-07-31 SURGERY — ORCHIECTOMY
Anesthesia: General | Laterality: Left

## 2023-07-31 MED ORDER — LACTATED RINGERS IV SOLN: INTRAVENOUS | Status: DC

## 2023-07-31 MED ORDER — BUPIVACAINE HCL (PF) 0.25 % IJ SOLN
INTRAMUSCULAR | Status: AC
  Filled 2023-07-31: qty 30

## 2023-07-31 MED ORDER — EPHEDRINE 5 MG/ML INJ
INTRAVENOUS | Status: AC
  Filled 2023-07-31: qty 5

## 2023-07-31 MED ORDER — MIDAZOLAM HCL 2 MG/2ML IJ SOLN
INTRAMUSCULAR | Status: AC
  Filled 2023-07-31: qty 2

## 2023-07-31 MED ORDER — ONDANSETRON HCL 4 MG/2ML IJ SOLN
INTRAMUSCULAR | Status: AC
  Filled 2023-07-31: qty 2

## 2023-07-31 MED ORDER — OXYCODONE HCL 5 MG PO TABS
5.0000 mg | ORAL_TABLET | Freq: Four times a day (QID) | ORAL | 0 refills | Status: DC | PRN
Start: 2023-07-31 — End: 2023-08-06

## 2023-07-31 MED ORDER — ACETAMINOPHEN 10 MG/ML IV SOLN: 1000.0000 mg | Freq: Once | INTRAVENOUS | Status: DC | PRN

## 2023-07-31 MED ORDER — FENTANYL CITRATE (PF) 100 MCG/2ML IJ SOLN
INTRAMUSCULAR | Status: DC | PRN
  Administered 2023-07-31 (×2): 25 ug via INTRAVENOUS
  Administered 2023-07-31: 50 ug via INTRAVENOUS
  Administered 2023-07-31: 25 ug via INTRAVENOUS
  Administered 2023-07-31: 50 ug via INTRAVENOUS
  Administered 2023-07-31: 25 ug via INTRAVENOUS
  Administered 2023-07-31: 50 ug via INTRAVENOUS

## 2023-07-31 MED ORDER — FENTANYL CITRATE (PF) 100 MCG/2ML IJ SOLN
INTRAMUSCULAR | Status: AC
  Filled 2023-07-31: qty 2

## 2023-07-31 MED ORDER — OXYCODONE HCL 5 MG PO TABS: 5.0000 mg | ORAL_TABLET | Freq: Once | ORAL | Status: DC | PRN

## 2023-07-31 MED ORDER — PROPOFOL 10 MG/ML IV BOLUS
INTRAVENOUS | Status: DC | PRN
  Administered 2023-07-31: 200 mg via INTRAVENOUS

## 2023-07-31 MED ORDER — 0.9 % SODIUM CHLORIDE (POUR BTL) OPTIME
TOPICAL | Status: DC | PRN
Start: 2023-07-31 — End: 2023-07-31
  Administered 2023-07-31: 1000 mL

## 2023-07-31 MED ORDER — EPHEDRINE SULFATE-NACL 50-0.9 MG/10ML-% IV SOSY
PREFILLED_SYRINGE | INTRAVENOUS | Status: DC | PRN
  Administered 2023-07-31 (×2): 5 mg via INTRAVENOUS
  Administered 2023-07-31: 10 mg via INTRAVENOUS
  Administered 2023-07-31: 5 mg via INTRAVENOUS

## 2023-07-31 MED ORDER — CHLORHEXIDINE GLUCONATE 0.12 % MT SOLN
15.0000 mL | Freq: Once | OROMUCOSAL | Status: AC
  Administered 2023-07-31: 15 mL via OROMUCOSAL

## 2023-07-31 MED ORDER — FENTANYL CITRATE PF 50 MCG/ML IJ SOSY: 25.0000 ug | PREFILLED_SYRINGE | INTRAMUSCULAR | Status: DC | PRN

## 2023-07-31 MED ORDER — BUPIVACAINE HCL (PF) 0.25 % IJ SOLN
INTRAMUSCULAR | Status: DC | PRN
  Administered 2023-07-31: 30 mL

## 2023-07-31 MED ORDER — ONDANSETRON HCL 4 MG/2ML IJ SOLN
INTRAMUSCULAR | Status: DC | PRN
  Administered 2023-07-31: 4 mg via INTRAVENOUS

## 2023-07-31 MED ORDER — ONDANSETRON HCL 4 MG/2ML IJ SOLN: 4.0000 mg | Freq: Once | INTRAMUSCULAR | Status: DC | PRN

## 2023-07-31 MED ORDER — LIDOCAINE 2% (20 MG/ML) 5 ML SYRINGE
INTRAMUSCULAR | Status: DC | PRN
  Administered 2023-07-31: 100 mg via INTRAVENOUS

## 2023-07-31 MED ORDER — DEXAMETHASONE SODIUM PHOSPHATE 10 MG/ML IJ SOLN
INTRAMUSCULAR | Status: AC
  Filled 2023-07-31: qty 1

## 2023-07-31 MED ORDER — MIDAZOLAM HCL 2 MG/2ML IJ SOLN
INTRAMUSCULAR | Status: DC | PRN
  Administered 2023-07-31: 2 mg via INTRAVENOUS

## 2023-07-31 MED ORDER — LIDOCAINE HCL (PF) 2 % IJ SOLN
INTRAMUSCULAR | Status: AC
  Filled 2023-07-31: qty 5

## 2023-07-31 MED ORDER — OXYCODONE HCL 5 MG PO TABS
5.0000 mg | ORAL_TABLET | ORAL | 0 refills | Status: DC | PRN
Start: 2023-07-31 — End: 2023-08-06

## 2023-07-31 MED ORDER — ORAL CARE MOUTH RINSE: 15.0000 mL | Freq: Once | OROMUCOSAL | Status: AC

## 2023-07-31 MED ORDER — DEXAMETHASONE SODIUM PHOSPHATE 10 MG/ML IJ SOLN
INTRAMUSCULAR | Status: DC | PRN
  Administered 2023-07-31: 4 mg via INTRAVENOUS

## 2023-07-31 MED ORDER — CEFAZOLIN SODIUM-DEXTROSE 2-4 GM/100ML-% IV SOLN
2.0000 g | INTRAVENOUS | Status: AC
  Administered 2023-07-31: 2 g via INTRAVENOUS
  Filled 2023-07-31: qty 100

## 2023-07-31 MED ORDER — OXYCODONE HCL 5 MG/5ML PO SOLN: 5.0000 mg | Freq: Once | ORAL | Status: DC | PRN

## 2023-07-31 SURGICAL SUPPLY — 41 items
ADH SKN CLS APL DERMABOND .7 (GAUZE/BANDAGES/DRESSINGS)
BAG COUNTER SPONGE SURGICOUNT (BAG) IMPLANT
BAG SPNG CNTER NS LX DISP (BAG)
BNDG GAUZE DERMACEA FLUFF 4 (GAUZE/BANDAGES/DRESSINGS) ×1 IMPLANT
BNDG GZE DERMACEA 4 6PLY (GAUZE/BANDAGES/DRESSINGS) ×1
BRIEF MESH DISP LRG (UNDERPADS AND DIAPERS) IMPLANT
COVER SURGICAL LIGHT HANDLE (MISCELLANEOUS) ×1 IMPLANT
DERMABOND ADVANCED .7 DNX12 (GAUZE/BANDAGES/DRESSINGS) IMPLANT
DRAPE LAPAROTOMY T 98X78 PEDS (DRAPES) ×1 IMPLANT
DRSG TELFA 4X10 ISLAND STR (GAUZE/BANDAGES/DRESSINGS) IMPLANT
ELECT NDL TIP 2.8 STRL (NEEDLE) ×1 IMPLANT
ELECT NEEDLE TIP 2.8 STRL (NEEDLE) ×1 IMPLANT
ELECT REM PT RETURN 15FT ADLT (MISCELLANEOUS) ×1 IMPLANT
GLOVE SURG LX STRL 7.5 STRW (GLOVE) ×1 IMPLANT
GOWN STRL REUS W/ TWL XL LVL3 (GOWN DISPOSABLE) ×1 IMPLANT
GOWN STRL REUS W/TWL XL LVL3 (GOWN DISPOSABLE) ×1
KIT BASIN OR (CUSTOM PROCEDURE TRAY) ×1 IMPLANT
KIT TURNOVER KIT A (KITS) IMPLANT
NDL HYPO 22X1.5 SAFETY MO (MISCELLANEOUS) IMPLANT
NEEDLE HYPO 22X1.5 SAFETY MO (MISCELLANEOUS) IMPLANT
NS IRRIG 1000ML POUR BTL (IV SOLUTION) ×1 IMPLANT
PACK GENERAL/GYN (CUSTOM PROCEDURE TRAY) ×1 IMPLANT
PENCIL SMOKE EVACUATOR (MISCELLANEOUS) IMPLANT
SPIKE FLUID TRANSFER (MISCELLANEOUS) ×1 IMPLANT
STAPLER VISISTAT 35W (STAPLE) IMPLANT
SUPPORTER AHLETIC TETRA LG (SOFTGOODS) ×1 IMPLANT
SUT CHROMIC 3 0 SH 27 (SUTURE) ×2 IMPLANT
SUT MNCRL AB 4-0 PS2 18 (SUTURE) IMPLANT
SUT PROLENE 2 0 SH DA (SUTURE) IMPLANT
SUT PROLENE 4 0 RB 1 (SUTURE)
SUT PROLENE 4-0 RB1 .5 CRCL 36 (SUTURE) IMPLANT
SUT VIC AB 1 CT1 27 (SUTURE) ×1
SUT VIC AB 1 CT1 27XBRD ANTBC (SUTURE) IMPLANT
SUT VIC AB 2-0 UR5 27 (SUTURE) IMPLANT
SUT VIC AB 3-0 SH 27 (SUTURE) ×4
SUT VIC AB 3-0 SH 27X BRD (SUTURE) IMPLANT
SUT VICRYL 0 TIES 12 18 (SUTURE) IMPLANT
SYR CONTROL 10ML LL (SYRINGE) IMPLANT
TOWEL OR 17X26 10 PK STRL BLUE (TOWEL DISPOSABLE) ×2 IMPLANT
TOWEL OR NON WOVEN STRL DISP B (DISPOSABLE) ×1 IMPLANT
WATER STERILE IRR 1000ML POUR (IV SOLUTION) ×1 IMPLANT

## 2023-07-31 NOTE — Anesthesia Procedure Notes (Signed)
Procedure Name: LMA Insertion Date/Time: 07/31/2023 1:03 PM  Performed by: Sindy Guadeloupe, CRNAPre-anesthesia Checklist: Patient identified, Emergency Drugs available, Suction available, Patient being monitored and Timeout performed Patient Re-evaluated:Patient Re-evaluated prior to induction Oxygen Delivery Method: Circle system utilized Preoxygenation: Pre-oxygenation with 100% oxygen Induction Type: IV induction Ventilation: Mask ventilation without difficulty LMA: LMA inserted LMA Size: 5.0 Number of attempts: 1 Tube secured with: Tape Dental Injury: Teeth and Oropharynx as per pre-operative assessment

## 2023-07-31 NOTE — Transfer of Care (Signed)
Immediate Anesthesia Transfer of Care Note  Patient: Brett Owen  Procedure(s) Performed: ORCHIECTOMY (Left)  Patient Location: PACU  Anesthesia Type:General  Level of Consciousness: awake and drowsy  Airway & Oxygen Therapy: Patient Spontanous Breathing and Patient connected to face mask oxygen  Post-op Assessment: Report given to RN and Post -op Vital signs reviewed and stable  Post vital signs: Reviewed and stable  Last Vitals:  Vitals Value Taken Time  BP 141/88 07/31/23 1431  Temp    Pulse 73 07/31/23 1433  Resp 17 07/31/23 1433  SpO2 100 % 07/31/23 1433  Vitals shown include unfiled device data.  Last Pain:  Vitals:   07/31/23 1149  PainSc: 0-No pain         Complications: No notable events documented.

## 2023-07-31 NOTE — Op Note (Signed)
OPERATIVE NOTE   Patient Name: Brett Owen  MRN: [id number]   Date of Procedure: 07/31/2023   Preoperative diagnosis:  Left testis mass; left hydrocele  Postoperative diagnosis:  same  Procedure:  Left inguinal orchiectomy  Attending: Marcha Solders  Assistant:  Milderd Meager, MD  Anesthesia: general  Estimated blood loss: minimal  Fluids: Per anesthesia record  Drains: none  Specimens  Antibiotics: left testis and cord  Findings: large left hydrocele; solid mass left lower pole testis  Indications:  Left testis mass  Description of Procedure:  The patient was brought to the operating room where he was correctly identified by providing his name and date of birth and subsequently underwent satisfactory induction of general anesthesia.  In the supine position his left lower quadrant as well as external genitalia were prepped and draped in the usual sterile fashion.  On exam the patient had a very large tense left hydrocele.   A 16 ga angiocath was inserted into the dependent scrotum and the hydrocele was drained of of straw colored fluid.  This was done to facillitate delivery of the testis inguinally.  A skin incision was subsequently made on a line between the left anterior superior iliac spine and the pubic tubercle.  This was carried through the subcutaneous tissue and Scarpa's fascia with the electrocautery. Using sharp and blunt dissection the soft tissue overlying the external oblique fascia was dissected clean. The external oblique fascia was then incised with a 15 blade knife and each edge were grasped with a hemostat and the remainder of the fascia from the external to the internal inguinal ring was incised with the scissors.  The ilioinguinal nerve was identified and preserved.  Using further sharp and blunt dissection the spermatic cord was dissected free and encircled with a 1/2 in penrose drain in a Potts manner.  This was used to occlude the cord  and provide traction.  Dissection was then carried out distally which was atypical due to the large hydrocele sac which was adherent to the scrotal tissue and required dissection to free.  The testis was then completely freed and I turned my attention proximally up to the internal ring.  The cord was dissected to the peritoneal reflection.  At this point, 2 kelly clamps were placed across the cord and the cord transected with the specimen (left testis and cord) passed off the table for path analysis.  The cord with doubly suture ligated with #1 vicryl and taged with a 3-0 prolene and tucked into the peritoneal cavity.  At this time preparations were made for closure.  Hemostasis was verified in both the inguinal and scrotal areas.  The would was irrigated with copious amounts of saline.  The wound edges were infiltrated with .25% marcane and closure was carried out in layers. The EOF was closed with interrupted 3-0 vicryl.  The subQ was closed with a running suture of 3-0 vicryl and the skin was closed with clips.  Procedure well tolerated.  No complications.  Transferred to PACU stable.  Complications: None  Condition: Stable, extubated, transferred to PACU  Plan: FU approx 10 days for staple removal

## 2023-07-31 NOTE — Anesthesia Preprocedure Evaluation (Addendum)
Anesthesia Evaluation  Patient identified by MRN, date of birth, ID band Patient awake    Reviewed: Allergy & Precautions, H&P , NPO status , Patient's Chart, lab work & pertinent test results  Airway Mallampati: II  TM Distance: >3 FB Neck ROM: Full    Dental no notable dental hx.    Pulmonary neg pulmonary ROS   Pulmonary exam normal breath sounds clear to auscultation       Cardiovascular hypertension, Pt. on medications + Valvular Problems/Murmurs MR  Rhythm:Regular Rate:Normal + Systolic murmurs    Neuro/Psych negative neurological ROS  negative psych ROS   GI/Hepatic negative GI ROS, Neg liver ROS,,,  Endo/Other  negative endocrine ROS    Renal/GU Renal InsufficiencyRenal disease  negative genitourinary   Musculoskeletal negative musculoskeletal ROS (+)    Abdominal   Peds negative pediatric ROS (+)  Hematology negative hematology ROS (+)   Anesthesia Other Findings   Reproductive/Obstetrics negative OB ROS                             Anesthesia Physical Anesthesia Plan  ASA: 3  Anesthesia Plan: General   Post-op Pain Management: Toradol IV (intra-op)*   Induction: Intravenous  PONV Risk Score and Plan: 2 and Ondansetron, Dexamethasone and Treatment may vary due to age or medical condition  Airway Management Planned: LMA  Additional Equipment:   Intra-op Plan:   Post-operative Plan: Extubation in OR  Informed Consent: I have reviewed the patients History and Physical, chart, labs and discussed the procedure including the risks, benefits and alternatives for the proposed anesthesia with the patient or authorized representative who has indicated his/her understanding and acceptance.     Dental advisory given  Plan Discussed with: CRNA and Surgeon  Anesthesia Plan Comments:        Anesthesia Quick Evaluation

## 2023-07-31 NOTE — Interval H&P Note (Signed)
History and Physical Interval Note:  07/31/2023 12:45 PM  Brett Owen  has presented today for surgery, with the diagnosis of left testis tumor with large hydrocele.  The various methods of treatment have been discussed with the patient and family. After consideration of risks, benefits and other options for treatment, the patient has consented to  Procedure(s): ORCHIECTOMY (Left) as a surgical intervention.  The patient's history has been reviewed, patient examined, no change in status, stable for surgery.  I have reviewed the patient's chart and labs.  Questions were answered to the patient's satisfaction.     Joline Maxcy

## 2023-08-01 ENCOUNTER — Encounter (HOSPITAL_COMMUNITY): Payer: Self-pay | Admitting: Urology

## 2023-08-01 NOTE — Anesthesia Postprocedure Evaluation (Signed)
Anesthesia Post Note  Patient: Brett Owen  Procedure(s) Performed: ORCHIECTOMY (Left)     Patient location during evaluation: PACU Anesthesia Type: General Level of consciousness: awake Pain management: pain level controlled Vital Signs Assessment: post-procedure vital signs reviewed and stable Respiratory status: spontaneous breathing Cardiovascular status: stable Postop Assessment: no apparent nausea or vomiting Anesthetic complications: no  No notable events documented.  Last Vitals:  Vitals:   07/31/23 1500 07/31/23 1508  BP: (!) 140/81 (!) 144/93  Pulse: 70 78  Resp: 13 14  Temp: 36.5 C 36.5 C  SpO2: 99% 100%    Last Pain:  Vitals:   07/31/23 1508  PainSc: 0-No pain   Pain Goal:                   Caren Macadam

## 2023-08-02 ENCOUNTER — Telehealth: Payer: Self-pay | Admitting: Urology

## 2023-08-02 LAB — SURGICAL PATHOLOGY

## 2023-08-02 NOTE — Telephone Encounter (Signed)
I called patient with path results from left orchiectomy.  No tumor.  Testis infarct. He is recovering well with no significant scrotal swelling.  Mild incisional pain. Will FU next week for staple removal

## 2023-08-06 ENCOUNTER — Encounter: Payer: Self-pay | Admitting: Internal Medicine

## 2023-08-06 ENCOUNTER — Ambulatory Visit (INDEPENDENT_AMBULATORY_CARE_PROVIDER_SITE_OTHER): Payer: Medicare Other | Admitting: Internal Medicine

## 2023-08-06 VITALS — BP 122/68 | HR 78 | Temp 97.8°F | Resp 16 | Ht 74.0 in | Wt 201.1 lb

## 2023-08-06 DIAGNOSIS — Z7185 Encounter for immunization safety counseling: Secondary | ICD-10-CM

## 2023-08-06 DIAGNOSIS — I5022 Chronic systolic (congestive) heart failure: Secondary | ICD-10-CM | POA: Diagnosis not present

## 2023-08-06 DIAGNOSIS — I10 Essential (primary) hypertension: Secondary | ICD-10-CM | POA: Diagnosis not present

## 2023-08-06 NOTE — Progress Notes (Signed)
Subjective:    Patient ID: Brett Owen, male    DOB: 26-Sep-1956, 67 y.o.   MRN: [id number]  DOS:  08/06/2023 Type of visit - description: Follow-up  See last visit, had a lump at the left testicle, fortunately had surgery and pathology was benign. He reports absolutely no pain after surgery.  Denies chest pain or difficulty breathing. No lower extremity edema  Review of Systems See above   Past Medical History:  Diagnosis Date   Cardiomegaly 12/2017   seen on chest x-ray   CHF (congestive heart failure) (HCC)    Chronic systolic heart failure (HCC) 01/15/2018   Nuclear stress test 03/19/18 - EF 28, diaph atten, no ischemia, probable non-ischemic cardiomyopathy; High Risk due to low EF // Echo 01/14/18 - EF 25-30, mild AI, mod MR, mild LAE, mild RV dilation, mild reduced RVSF, mod RAE, small eff // Echo 12/19: EF 55-60, no RWMA, GR 1 DD, GLS -13.6%, mild AI,trivial MR, mild LAE, normal RVSF, PASP 23    Hypertension     Past Surgical History:  Procedure Laterality Date   herniated disc surgery  09/1999   lumbar   ORCHIECTOMY Left 07/31/2023   Procedure: ORCHIECTOMY;  Surgeon: Joline Maxcy, MD;  Location: [facility];  Service: Urology;  Laterality: Left;    Current Outpatient Medications  Medication Instructions   aspirin EC 81 mg, Oral, Daily   atorvastatin (LIPITOR) 20 mg, Oral, Daily   carvedilol (COREG) 12.5 mg, Oral, 2 times daily with meals   Multiple Vitamin (MULTIVITAMIN WITH MINERALS) TABS tablet 1 tablet, Oral, Daily   sacubitril-valsartan (ENTRESTO) 49-51 MG 1 tablet, Oral, 2 times daily       Objective:   Physical Exam BP 122/68   Pulse 78   Temp 97.8 F (36.6 C) (Oral)   Resp 16   Ht 6\' 2"  (1.88 m)   Wt 201 lb 2 oz (91.2 kg)   SpO2 98%   BMI 25.82 kg/m  General:   Well developed, NAD, BMI noted. HEENT:  Normocephalic . Face symmetric, atraumatic Lungs:  CTA B Normal respiratory effort, no intercostal retractions, no accessory muscle  use. Heart: RRR,  no murmur.  Lower extremities: no pretibial edema bilaterally  Skin: Not pale. Not jaundice Neurologic:  alert & oriented X3.  Speech normal, gait appropriate for age and unassisted Psych--  Cognition and judgment appear intact.  Cooperative with normal attention span and concentration.  Behavior appropriate. No anxious or depressed appearing.      Assessment    Assessment (new patient 12-2016) A1c: 5.8 HTN new dx 12-2017 High cholesterol CHF new dx  12-2017.  EF 25%, lexiscan 03/2018 (-) ischemia.    PLAN Left scrotal swelling: Had a [place] suspicious for malignancy, underwent orchiectomy, results were benign.  I am extremely happy for the patient.  He has currently no pain.  Will see urology soon. HTN, CHF: On carvedilol, Entresto, recent CMP okay.  No change. High cholesterol: On atorvastatin.  Last FLP satisfactory. Preventive care: Multiple vaccines advised, patient is reluctant to proceed, pros> cons. RTC 11-2023 routine checkup

## 2023-08-06 NOTE — Patient Instructions (Addendum)
Vaccines I recommend: Covid booster RSV vaccine Flu shot Pneumonia shot    Check the  blood pressure regularly Blood pressure goal:  between 110/65 and  135/85. If it is consistently higher or lower, let me know    Next visit with me  11/2023    Please schedule it at the front desk

## 2023-08-06 NOTE — Assessment & Plan Note (Signed)
Left scrotal swelling: Had a [place] suspicious for malignancy, underwent orchiectomy, results were benign.  I am extremely happy for the patient.  He has currently no pain.  Will see urology soon. HTN, CHF: On carvedilol, Entresto, recent CMP okay.  No change. High cholesterol: On atorvastatin.  Last FLP satisfactory. Preventive care: Multiple vaccines advised, patient is reluctant to proceed, pros> cons. RTC 11-2023 routine checkup

## 2023-08-10 ENCOUNTER — Ambulatory Visit (INDEPENDENT_AMBULATORY_CARE_PROVIDER_SITE_OTHER): Payer: Medicare Other | Admitting: Urology

## 2023-08-10 ENCOUNTER — Encounter: Payer: Self-pay | Admitting: Urology

## 2023-08-10 VITALS — BP 110/74 | HR 82 | Ht 74.0 in | Wt 202.0 lb

## 2023-08-10 DIAGNOSIS — Z09 Encounter for follow-up examination after completed treatment for conditions other than malignant neoplasm: Secondary | ICD-10-CM

## 2023-08-10 DIAGNOSIS — N5089 Other specified disorders of the male genital organs: Secondary | ICD-10-CM | POA: Insufficient documentation

## 2023-08-10 DIAGNOSIS — Z87438 Personal history of other diseases of male genital organs: Secondary | ICD-10-CM

## 2023-08-10 NOTE — Progress Notes (Signed)
   Assessment: 1. Testicular mass; left, s/p radical orchiectomy; path shows testicular infarct     Plan: Staples removed and Steri-Strips applied. Continue postoperative restrictions for 6 weeks postop. Return to office in 1 month with Dr. Margo Aye.  Chief Complaint: Chief Complaint  Patient presents with   Testicular Mass    HPI: Brett Owen is a 67 y.o. male who presents for continued evaluation of left testicular mass. The patient was found to have a left testicular mass. Scrotal ultrasound 07/24/2023-- IMPRESSION: 1. Ill-defined hypoenhancing lesion of the inferior pole of the left testicle measuring 2.3 x 2.0 x 1.4 cm. This is concerning for a testicular neoplasm. Recommend Urology consultation. 2. Small right, large left hydroceles. 3. No evidence of testicular torsion.  He underwent a left radical orchiectomy on 07/31/2023. Pathology showed no evidence of malignancy and testicular infarct. He presents today for postoperative examination and staple removal.  He is doing well postoperatively.  His pain is well-controlled.  No erythema or drainage from the incision.  His appetite is good.  No significant scrotal swelling.  Portions of the above documentation were copied from a prior visit for review purposes only.  Allergies: No Known Allergies  PMH: Past Medical History:  Diagnosis Date   Cardiomegaly 12/2017   seen on chest x-ray   CHF (congestive heart failure) (HCC)    Chronic systolic heart failure (HCC) 01/15/2018   Nuclear stress test 03/19/18 - EF 28, diaph atten, no ischemia, probable non-ischemic cardiomyopathy; High Risk due to low EF // Echo 01/14/18 - EF 25-30, mild AI, mod MR, mild LAE, mild RV dilation, mild reduced RVSF, mod RAE, small eff // Echo 12/19: EF 55-60, no RWMA, GR 1 DD, GLS -13.6%, mild AI,trivial MR, mild LAE, normal RVSF, PASP 23    Hypertension     PSH: Past Surgical History:  Procedure Laterality Date   herniated disc surgery  09/1999    lumbar   ORCHIECTOMY Left 07/31/2023   Procedure: ORCHIECTOMY;  Surgeon: Joline Maxcy, MD;  Location: [facility];  Service: Urology;  Laterality: Left;    SH: Social History   Tobacco Use   Smoking status: Never   Smokeless tobacco: Never  Vaping Use   Vaping status: Never Used  Substance Use Topics   Alcohol use: Not Currently    Comment: seasonal, maybe at holidays   Drug use: Never    ROS: Constitutional:  Negative for fever, chills, weight loss CV: Negative for chest pain, previous MI, hypertension Respiratory:  Negative for shortness of breath, wheezing, sleep apnea, frequent cough GI:  Negative for nausea, vomiting, bloody stool, GERD  PE: BP 110/74   Pulse 82   Ht 6\' 2"  (1.88 m)   Wt 202 lb (91.6 kg)   BMI 25.94 kg/m  GENERAL APPEARANCE:  Well appearing, well developed, well nourished, NAD HEENT:  Atraumatic, normocephalic, oropharynx clear NECK:  Supple without lymphadenopathy or thyromegaly ABDOMEN:  Soft, non-tender, no masses; left inguinal incision healing well, no erythema or drainage EXTREMITIES:  Moves all extremities well, without clubbing, cyanosis, or edema NEUROLOGIC:  Alert and oriented x 3, normal gait, CN II-XII grossly intact MENTAL STATUS:  appropriate BACK:  Non-tender to palpation, No CVAT SKIN:  Warm, dry, and intact GU:  mild left scrotal edema   Results: None

## 2023-08-14 ENCOUNTER — Encounter: Payer: Self-pay | Admitting: Urology

## 2023-08-22 ENCOUNTER — Other Ambulatory Visit: Payer: Self-pay

## 2023-08-22 DIAGNOSIS — Z1211 Encounter for screening for malignant neoplasm of colon: Secondary | ICD-10-CM

## 2023-09-04 ENCOUNTER — Encounter: Payer: Self-pay | Admitting: Urology

## 2023-09-04 ENCOUNTER — Ambulatory Visit (INDEPENDENT_AMBULATORY_CARE_PROVIDER_SITE_OTHER): Payer: Medicare Other | Admitting: Urology

## 2023-09-04 VITALS — BP 117/75 | HR 72 | Ht 74.0 in | Wt 205.0 lb

## 2023-09-04 DIAGNOSIS — N5089 Other specified disorders of the male genital organs: Secondary | ICD-10-CM

## 2023-09-04 NOTE — Addendum Note (Signed)
Addended by: Marcha Solders C on: 09/04/2023 12:22 PM   Modules accepted: Orders, Level of Service

## 2023-09-04 NOTE — Progress Notes (Signed)
   Assessment: 1. Testicular mass     Plan: Today I discussed the left scrotal swelling/fluid collection with the patient.  I have recommended to go ahead and obtain a scrotal ultrasound.  I will see him back after the ultrasound in the next few weeks.  Chief Complaint: Testis lesion  HPI: Brett Owen is a 67 y.o. male who presents for postop follow-up now approximately 4 weeks out following a left inguinal orchiectomy for a large hydrocele and testis lesion that turned out to be a testicular infarct.  Patient has done quite well postop but has noted some swelling on his left hemiscrotum over the last 2 weeks.  On exam it feels like he has a left scrotal hematoma/seroma.  It is nontender.  There is no evidence of infection.  He otherwise feels well.   Portions of the above documentation were copied from a prior visit for review purposes only.  Allergies: No Known Allergies  PMH: Past Medical History:  Diagnosis Date   Cardiomegaly 12/2017   seen on chest x-ray   CHF (congestive heart failure) (HCC)    Chronic systolic heart failure (HCC) 01/15/2018   Nuclear stress test 03/19/18 - EF 28, diaph atten, no ischemia, probable non-ischemic cardiomyopathy; High Risk due to low EF // Echo 01/14/18 - EF 25-30, mild AI, mod MR, mild LAE, mild RV dilation, mild reduced RVSF, mod RAE, small eff // Echo 12/19: EF 55-60, no RWMA, GR 1 DD, GLS -13.6%, mild AI,trivial MR, mild LAE, normal RVSF, PASP 23    Hypertension     PSH: Past Surgical History:  Procedure Laterality Date   herniated disc surgery  09/1999   lumbar   ORCHIECTOMY Left 07/31/2023   Procedure: ORCHIECTOMY;  Surgeon: Joline Maxcy, MD;  Location: [facility];  Service: Urology;  Laterality: Left;    SH: Social History   Tobacco Use   Smoking status: Never   Smokeless tobacco: Never  Vaping Use   Vaping status: Never Used  Substance Use Topics   Alcohol use: Not Currently    Comment: seasonal, maybe at holidays   Drug  use: Never    ROS: Constitutional:  Negative for fever, chills, weight loss CV: Negative for chest pain, previous MI, hypertension Respiratory:  Negative for shortness of breath, wheezing, sleep apnea, frequent cough GI:  Negative for nausea, vomiting, bloody stool, GERD  PE: BP 117/75   Pulse 72   Ht 6\' 2"  (1.88 m)   Wt 205 lb (93 kg)   BMI 26.32 kg/m  GENERAL APPEARANCE:  Well appearing, well developed, well nourished, NAD  GU: Circumcised phallus and normal right testis and cord.  Status post left orchiectomy now with a upper left scrotal contents collection that is nontender to palpation and does not appear to extend into the inguinal region.  Likely hematoma/seroma

## 2023-09-22 ENCOUNTER — Ambulatory Visit (HOSPITAL_BASED_OUTPATIENT_CLINIC_OR_DEPARTMENT_OTHER)
Admission: RE | Admit: 2023-09-22 | Discharge: 2023-09-22 | Disposition: A | Discharge status: Home / Self Care | Payer: Medicare Other | Source: Ambulatory Visit | Attending: Urology | Admitting: Urology

## 2023-09-22 DIAGNOSIS — N5089 Other specified disorders of the male genital organs: Secondary | ICD-10-CM

## 2023-09-25 LAB — COLOGUARD

## 2023-09-27 ENCOUNTER — Ambulatory Visit: Payer: Medicare Other | Admitting: Urology

## 2023-09-27 ENCOUNTER — Encounter: Payer: Self-pay | Admitting: Urology

## 2023-09-27 VITALS — BP 132/75 | HR 41

## 2023-09-27 DIAGNOSIS — S3022XA Contusion of scrotum and testes, initial encounter: Secondary | ICD-10-CM

## 2023-09-27 DIAGNOSIS — N5089 Other specified disorders of the male genital organs: Secondary | ICD-10-CM | POA: Diagnosis not present

## 2023-09-27 DIAGNOSIS — Z9889 Other specified postprocedural states: Secondary | ICD-10-CM | POA: Diagnosis not present

## 2023-09-27 NOTE — Progress Notes (Signed)
   Assessment: 1. Scrotal hematoma     Plan: Today I reviewed the patient's recent ultrasound which shows a multiloculated fluid collection In the scrotum consistent with evolving hematoma.  This should involute over time and is currently not bothering him.  Follow-up as needed  Chief Complaint: Scrotal mass  HPI: Brett Owen is a 67 y.o. male who presents for follow-up status post left inguinal orchiectomy for large inflammatory hydrocele and testis lesion that turned out to be an infarct.  On his postop visit 3 weeks ago he was noted to have what was felt to be a fluid collection in the left upper scrotum.  On ultrasound this is a multiloculated fluid collection most likely a hematoma evolving.  It is not tender and does not bother him.   Portions of the above documentation were copied from a prior visit for review purposes only.  Allergies: No Known Allergies  PMH: Past Medical History:  Diagnosis Date   Cardiomegaly 12/2017   seen on chest x-ray   CHF (congestive heart failure) (HCC)    Chronic systolic heart failure (HCC) 01/15/2018   Nuclear stress test 03/19/18 - EF 28, diaph atten, no ischemia, probable non-ischemic cardiomyopathy; High Risk due to low EF // Echo 01/14/18 - EF 25-30, mild AI, mod MR, mild LAE, mild RV dilation, mild reduced RVSF, mod RAE, small eff // Echo 12/19: EF 55-60, no RWMA, GR 1 DD, GLS -13.6%, mild AI,trivial MR, mild LAE, normal RVSF, PASP 23    Hypertension     PSH: Past Surgical History:  Procedure Laterality Date   herniated disc surgery  09/1999   lumbar   ORCHIECTOMY Left 07/31/2023   Procedure: ORCHIECTOMY;  Surgeon: Joline Maxcy, MD;  Location: [facility];  Service: Urology;  Laterality: Left;    SH: Social History   Tobacco Use   Smoking status: Never   Smokeless tobacco: Never  Vaping Use   Vaping status: Never Used  Substance Use Topics   Alcohol use: Not Currently    Comment: seasonal, maybe at holidays   Drug use: Never     ROS: Constitutional:  Negative for fever, chills, weight loss CV: Negative for chest pain, previous MI, hypertension Respiratory:  Negative for shortness of breath, wheezing, sleep apnea, frequent cough GI:  Negative for nausea, vomiting, bloody stool, GERD  PE: BP 132/75   Pulse (!) 41  GENERAL APPEARANCE:  Well appearing, well developed, well nourished, NAD  GU:  left scrotal hematoma unchanged

## 2023-10-31 LAB — COLOGUARD: COLOGUARD: NEGATIVE

## 2023-11-08 ENCOUNTER — Telehealth: Payer: Self-pay | Admitting: Internal Medicine

## 2023-11-08 NOTE — Telephone Encounter (Signed)
Copied from CRM [id number]. Topic: Medicare AWV >> Nov 08, 2023 11:56 AM Payton Doughty wrote: Reason for CRM: Called LVM 11/08/2023 to schedule AWV. Please schedule office or virtual visits  Verlee Rossetti; Care Guide Ambulatory Clinical Support Eunice l [facility] Direct Dial: [phone]

## 2023-11-25 ENCOUNTER — Other Ambulatory Visit: Payer: Self-pay | Admitting: Internal Medicine

## 2023-12-10 ENCOUNTER — Ambulatory Visit: Payer: PRIVATE HEALTH INSURANCE | Admitting: Internal Medicine

## 2023-12-23 ENCOUNTER — Other Ambulatory Visit: Payer: Self-pay | Admitting: Cardiovascular Disease

## 2023-12-23 DIAGNOSIS — I5042 Chronic combined systolic (congestive) and diastolic (congestive) heart failure: Secondary | ICD-10-CM

## 2023-12-23 DIAGNOSIS — I10 Essential (primary) hypertension: Secondary | ICD-10-CM

## 2023-12-23 DIAGNOSIS — E782 Mixed hyperlipidemia: Secondary | ICD-10-CM

## 2024-01-21 ENCOUNTER — Ambulatory Visit: Payer: PRIVATE HEALTH INSURANCE | Admitting: Internal Medicine

## 2024-02-15 ENCOUNTER — Encounter: Payer: Self-pay | Admitting: Internal Medicine

## 2024-02-15 ENCOUNTER — Ambulatory Visit (INDEPENDENT_AMBULATORY_CARE_PROVIDER_SITE_OTHER): Payer: PRIVATE HEALTH INSURANCE | Admitting: Internal Medicine

## 2024-02-15 VITALS — BP 128/80 | HR 75 | Temp 98.0°F | Resp 16 | Ht 74.0 in | Wt 216.5 lb

## 2024-02-15 DIAGNOSIS — R7989 Other specified abnormal findings of blood chemistry: Secondary | ICD-10-CM

## 2024-02-15 DIAGNOSIS — E785 Hyperlipidemia, unspecified: Secondary | ICD-10-CM

## 2024-02-15 DIAGNOSIS — I5022 Chronic systolic (congestive) heart failure: Secondary | ICD-10-CM

## 2024-02-15 DIAGNOSIS — I10 Essential (primary) hypertension: Secondary | ICD-10-CM

## 2024-02-15 DIAGNOSIS — R739 Hyperglycemia, unspecified: Secondary | ICD-10-CM

## 2024-02-15 DIAGNOSIS — Z23 Encounter for immunization: Secondary | ICD-10-CM

## 2024-02-15 DIAGNOSIS — Z125 Encounter for screening for malignant neoplasm of prostate: Secondary | ICD-10-CM

## 2024-02-15 NOTE — Patient Instructions (Signed)
 INSTRUCTIONS  FOR TODAY   Check the  blood pressure regularly Blood pressure goal:  between 110/65 and  135/85. If it is consistently higher or lower, let me know     GO TO THE LAB : Get the blood work     Next office visit for a checkup in 6 to 8 months Please make an appointment before you leave today        "Health Care Power of attorney" (Also know as a  "Living will" or  Advance care planning documents)  If you already have a living will or healthcare power of attorney, is recommended you bring the copy to be scanned in your chart.   The document will be available to all the doctors you see in the system.  If you are over 39 y/o and don't have the document, please read:  Advance care planning is a process that supports adults in  understanding and sharing their preferences regarding future medical care.  The patient's preferences are recorded in documents called Advance Directives and the can be modified at any time while the patient is in full mental capacity.     More information at: [email or web address]

## 2024-02-15 NOTE — Progress Notes (Unsigned)
 Subjective:    Patient ID: Brett Owen, male    DOB: 1955-10-31, 68 y.o.   MRN: [id number]  DOS:  02/15/2024 Type of visit - description: ROV  Routine checkup. Feeling great. Chronic medical problems addressed. Preventive care discussed. Good med compliance. Denies chest pain or difficulty breathing. No DOE No nausea vomiting diarrhea or blood in the stools. No dysuria or gross hematuria.   Review of Systems See above   Past Medical History:  Diagnosis Date   Cardiomegaly 12/2017   seen on chest x-ray   CHF (congestive heart failure) (HCC)    Chronic systolic heart failure (HCC) 01/15/2018   Nuclear stress test 03/19/18 - EF 28, diaph atten, no ischemia, probable non-ischemic cardiomyopathy; High Risk due to low EF // Echo 01/14/18 - EF 25-30, mild AI, mod MR, mild LAE, mild RV dilation, mild reduced RVSF, mod RAE, small eff // Echo 12/19: EF 55-60, no RWMA, GR 1 DD, GLS -13.6%, mild AI,trivial MR, mild LAE, normal RVSF, PASP 23    Hypertension     Past Surgical History:  Procedure Laterality Date   herniated disc surgery  09/1999   lumbar   ORCHIECTOMY Left 07/31/2023   Procedure: ORCHIECTOMY;  Surgeon: Scarlet Curly, MD;  Location: [facility];  Service: Urology;  Laterality: Left;   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[employer])  Tobacco Use   Smoking status: Never   Smokeless tobacco: Never  Vaping Use   Vaping status: Never Used  Substance and Sexual Activity   Alcohol use: Not Currently    Comment: seasonal, maybe at holidays   Drug use: Never   Sexual activity: Not on file  Other Topics Concern   Not on file  Social History Narrative   Lives by himself   Social Drivers of Health   Financial Resource Strain: Low  Risk  (02/14/2024)   Overall Financial Resource Strain (CARDIA)    Difficulty of Paying Living Expenses: Not hard at all  Food Insecurity: No Food Insecurity (02/14/2024)   Hunger Vital Sign    Worried About Running Out of Food in the Last Year: Never true    Ran Out of Food in the Last Year: Never true  Transportation Needs: No Transportation Needs (02/14/2024)   PRAPARE - Administrator, Civil Service (Medical): No    Lack of Transportation (Non-Medical): No  Physical Activity: Sufficiently Active (02/14/2024)   Exercise Vital Sign    Days of Exercise per Week: 7 days    Minutes of Exercise per Session: 150+ min  Stress: No Stress Concern Present (02/14/2024)   [employer] of Occupational Health - Occupational Stress Questionnaire    Feeling of Stress : Not at all  Social Connections: Moderately Integrated (02/14/2024)   Social Connection and Isolation Panel [NHANES]    Frequency of Communication with Friends and Family: More than three times a week    Frequency of Social Gatherings with Friends and Family: Twice a week    Attends Religious Services: More than 4 times per year    Active Member of [employer] or Organizations: Yes    Attends Banker Meetings: More than 4 times per year    Marital  Status: Never married  Catering manager Violence: Not on file    Current Outpatient Medications  Medication Instructions   aspirin EC 81 mg, Daily   atorvastatin  (LIPITOR) 20 mg, Oral, Daily   carvedilol  (COREG ) 12.5 mg, Oral, 2 times daily with meals   ENTRESTO  49-51 MG 1 tablet, Oral, 2 times daily   Multiple Vitamin (MULTIVITAMIN WITH MINERALS) TABS tablet 1 tablet, Daily       Objective:   Physical Exam BP 128/80   Pulse 75   Temp 98 F (36.7 C) (Oral)   Resp 16   Ht 6\' 2"  (1.88 m)   Wt 216 lb 8 oz (98.2 kg)   SpO2 96%   BMI 27.80 kg/m  General: Well developed, NAD, BMI noted Neck: No  thyromegaly  HEENT:  Normocephalic . Face symmetric,  atraumatic Lungs:  CTA B Normal respiratory effort, no intercostal retractions, no accessory muscle use. Heart: RRR,  no murmur.  Abdomen:  Not distended, soft, non-tender. No rebound or rigidity.   Lower extremities: no pretibial edema bilaterally  Skin: Exposed areas without rash. Not pale. Not jaundice Neurologic:  alert & oriented X3.  Speech normal, gait appropriate for age and unassisted Strength symmetric and appropriate for age.  Psych: Cognition and judgment appear intact.  Cooperative with normal attention span and concentration.  Behavior appropriate. No anxious or depressed appearing.     Assessment   Assessment (new patient 12-2016) A1c: 5.8 HTN new dx 12-2017 High cholesterol CHF new dx  12-2017.  EF 25%, lexiscan 03/2018 (-) ischemia.   L orchiectomy October 2024: Benign  PLAN Hyperglycemia: Has a healthy lifestyle.  Check A1c. HTN: On Entresto , carvedilol .  BP looks good, recommend to check at home.  Check labs High cholesterol: On atorvastatin , last LFTs normal, check FLP. CHF: Seems to be doing well.  To see cardiology next month. Preventive care reviewed: - Tdap: 2023 - s/p Shingrix x2 -Vaccines I recommend: Flu shot every fall,   COVID booster is an option. -Prostate cancer screening:   Asymptomatic, check a PSA - CCS: +FH M age 89.  Cologuard - 03/2020.  Cologuard (-) 10/2023, next in 3 years  RTC 6 to 8 months

## 2024-02-16 LAB — BASIC METABOLIC PANEL WITH GFR
BUN/Creatinine Ratio: 16 (calc) (ref 6–22)
BUN: 24 mg/dL (ref 7–25)
CO2: 26 mmol/L (ref 20–32)
Calcium: 9.5 mg/dL (ref 8.6–10.3)
Chloride: 105 mmol/L (ref 98–110)
Creat: 1.49 mg/dL — ABNORMAL HIGH (ref 0.70–1.35)
Glucose, Bld: 95 mg/dL (ref 65–99)
Potassium: 4.4 mmol/L (ref 3.5–5.3)
Sodium: 140 mmol/L (ref 135–146)
eGFR: 51 mL/min/{1.73_m2} — ABNORMAL LOW (ref >=60)

## 2024-02-16 LAB — LIPID PANEL
Cholesterol: 104 mg/dL (ref <200)
HDL: 34 mg/dL — ABNORMAL LOW (ref >=40)
LDL Cholesterol (Calc): 45 mg/dL
Non-HDL Cholesterol (Calc): 70 mg/dL (ref <130)
Total CHOL/HDL Ratio: 3.1 (calc) (ref <5.0)
Triglycerides: 167 mg/dL — ABNORMAL HIGH (ref <150)

## 2024-02-16 LAB — HEMOGLOBIN A1C
Hgb A1c MFr Bld: 5.9 % — ABNORMAL HIGH (ref <5.7)
Mean Plasma Glucose: 123 mg/dL
eAG (mmol/L): 6.8 mmol/L

## 2024-02-16 LAB — PSA: PSA: 1.62 ng/mL (ref <=4.00)

## 2024-02-16 LAB — TSH: TSH: 2.25 m[IU]/L (ref 0.40–4.50)

## 2024-02-17 ENCOUNTER — Encounter: Payer: Self-pay | Admitting: Internal Medicine

## 2024-02-17 NOTE — Assessment & Plan Note (Signed)
 Preventive care reviewed: - Tdap: 2023 - s/p Shingrix x2 -Vaccines I recommend: Flu shot every fall,   COVID booster is an option. -Prostate cancer screening:   Asymptomatic, check a PSA - CCS: +FH M age 68.  Cologuard - 03/2020.  Cologuard (-) 10/2023, next in 3 years

## 2024-02-17 NOTE — Assessment & Plan Note (Signed)
 Hyperglycemia: Has a healthy lifestyle.  Check A1c. HTN: On Entresto , carvedilol .  BP looks good, recommend to check at home.  Check labs High cholesterol: On atorvastatin , last LFTs normal, check FLP. CHF: Seems to be doing well.  To see cardiology next month. Preventive care reviewed RTC 6 to 8 months

## 2024-02-18 ENCOUNTER — Encounter: Payer: Self-pay | Admitting: Internal Medicine

## 2024-02-18 NOTE — Addendum Note (Signed)
 Addended by: Clance Baquero D on: 02/18/2024 10:46 AM   Modules accepted: Orders

## 2024-03-21 ENCOUNTER — Other Ambulatory Visit (INDEPENDENT_AMBULATORY_CARE_PROVIDER_SITE_OTHER): Payer: PRIVATE HEALTH INSURANCE

## 2024-03-21 DIAGNOSIS — R7989 Other specified abnormal findings of blood chemistry: Secondary | ICD-10-CM | POA: Diagnosis not present

## 2024-03-22 LAB — BASIC METABOLIC PANEL WITHOUT GFR
BUN: 17 mg/dL (ref 7–25)
CO2: 26 mmol/L (ref 20–32)
Calcium: 9.5 mg/dL (ref 8.6–10.3)
Chloride: 103 mmol/L (ref 98–110)
Creat: 1.27 mg/dL (ref 0.70–1.35)
Glucose, Bld: 73 mg/dL (ref 65–99)
Potassium: 4.4 mmol/L (ref 3.5–5.3)
Sodium: 140 mmol/L (ref 135–146)

## 2024-03-26 ENCOUNTER — Ambulatory Visit: Payer: Self-pay | Admitting: Internal Medicine

## 2024-04-03 ENCOUNTER — Encounter: Payer: Self-pay | Admitting: Cardiovascular Disease

## 2024-04-03 NOTE — Progress Notes (Unsigned)
 Cardiology Office Note:    Date:  04/03/2024   ID:  Brett Owen, DOB 1956-05-24, MRN [id number]  PCP:  Ezell Hollow, MD  Cardiologist:  Ahmad Alert, MD   Referring MD: Ezell Hollow, MD   Problem list 1.  Acute on chronic combined systolic /  Diastolic  congestive heart failure: 2.  Essential hypertension 3.  Renal insufficiency  Chief Complaint  Patient presents with   Hyperlipidemia   Congestive Heart Failure         January 15, 2018    Brett Owen is a 68 y.o. male with a hx of hypertension and chronic kidney disease.  He is referred to see me for worsening shortness of breath.  He had an echocardiogram yesterday which showed severe left ventricular dysfunction with an ejection fraction of 25-30%.  He has mild aortic insufficiency, moderate mitral regurgitation.  There was a small pericardial effusion.  9 days ago, started having weight gain 1-2 lbs a week.   Developed swelling in ankles.  Progressed to calves and knees and eventually in the abd. Was not able to eat full meals Has developed shortness of breath,   HR has been 105 - 115  Has developed a dry cough,  Cough is worse with exertion + PND and orthopnea ,  Sleeps in a relciner propped up  15 lb weight gain .  Has not gone to the doctor regularly  Went to the ER and then Dr. Neomi Banks  In the [facility] for HTN he was started on amlodipine  5 mg a day and HCTZ 25 mg a day.  He is he saw Dr. Neomi Banks  several days later.  The HCTZ was discontinued and he was started on Lasix .  Carvedilol  3.125 mg twice a day was started.   The abdominal pressure has resolved slightly .  Has stopped gaining weight. Still has PND and orthopnea  No chest pain, no dizziness, no blurry vision  Tries to limit his salt intake. Has had some CKD  - Creatinine is 1.6   Mother had HTN, maternal grandfather had HTN,   Retired Building control surveyor , now works security  Does not exercise regularly .   April 16, 2 019: Brett Owen seen today  for follow-up of his acute on chronic combined systolic and diastolic congestive heart failure. He has had some episodes of orthostatic hypotension. Breathing is much better, no PND or orthopnea  Ankle edema is much better  HR is much better.  He was tachycardic - now is well controlled   Mar 04, 2018:     Brett Owen is seen back today for follow-up of his congestive heart failure. Is doing well.   Is walking twice a week.   Is mowing his grass  June 26, 2018: Brett Owen seen today for follow-up of his congestive heart failure. Doing well   September 23, 2019: Brett Owen is seen today for follow-up of his chronic combined systolic and diastolic congestive heart failure.  BP is a bit up a bit.  Has been eating some extra recently  BP at home is 120-130  No CP or dyspnea.   Jan. 24, 2023; Brett Owen is seen today for follow up of his CHF  Wt is 219 lbs  No CP or dyspnea , still very active.  Was working in [employer] ,, now works with [employer] .  Feb. 27, 2024 Brett Owen is seen for further evaluation and management of his CHF Wt is 213 lbs ( down  6 lbs from last year)  EF has improved on coreg  12.5, entresto  49-51 BID,  Tries to avoid salt and salty food  Walks regularly  Still working with [employer]    April 07, 2024 Brett Owen is seen for follow up of his CHF  On Entresto  49-51 BID, coreg  Will consider spiro / jardiance ...   Past Medical History:  Diagnosis Date   Cardiomegaly 12/2017   seen on chest x-ray   CHF (congestive heart failure) (HCC)    Chronic systolic heart failure (HCC) 01/15/2018   Nuclear stress test 03/19/18 - EF 28, diaph atten, no ischemia, probable non-ischemic cardiomyopathy; High Risk due to low EF // Echo 01/14/18 - EF 25-30, mild AI, mod MR, mild LAE, mild RV dilation, mild reduced RVSF, mod RAE, small eff // Echo 12/19: EF 55-60, no RWMA, GR 1 DD, GLS -13.6%, mild AI,trivial MR, mild LAE, normal RVSF, PASP 23    Hypertension     Past Surgical History:  Procedure  Laterality Date   herniated disc surgery  09/1999   lumbar   ORCHIECTOMY Left 07/31/2023   Procedure: ORCHIECTOMY;  Surgeon: Scarlet Curly, MD;  Location: [facility];  Service: Urology;  Laterality: Left;    Current Medications: No outpatient medications have been marked as taking for the 04/07/24 encounter (Office Visit) with Jaimi Belle, Lela Purple, MD.     Allergies:   Patient has no known allergies.   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[employer])  Tobacco Use   Smoking status: Never   Smokeless tobacco: Never  Vaping Use   Vaping status: Never Used  Substance and Sexual Activity   Alcohol use: Not Currently    Comment: seasonal, maybe at holidays   Drug use: Never   Sexual activity: Not on file  Other Topics Concern   Not on file  Social History Narrative   Lives by himself   Social Drivers of Health   Financial Resource Strain: Low Risk  (02/14/2024)   Overall Financial Resource Strain (CARDIA)    Difficulty of Paying Living Expenses: Not hard at all  Food Insecurity: No Food Insecurity (02/14/2024)   Hunger Vital Sign    Worried About Running Out of Food in the Last Year: Never true    Ran Out of Food in the Last Year: Never true  Transportation Needs: No Transportation Needs (02/14/2024)   PRAPARE - Administrator, Civil Service (Medical): No    Lack of Transportation (Non-Medical): No  Physical Activity: Sufficiently Active (02/14/2024)   Exercise Vital Sign    Days of Exercise per Week: 7 days    Minutes of Exercise per Session: 150+ min  Stress: No Stress Concern Present (02/14/2024)   [employer] of Occupational Health - Occupational Stress Questionnaire    Feeling of Stress : Not at all  Social Connections:  Moderately Integrated (02/14/2024)   Social Connection and Isolation Panel    Frequency of Communication with Friends and Family: More than three times a week    Frequency of Social Gatherings with Friends and Family: Twice a week    Attends Religious Services: More than 4 times per year    Active Member of [employer] or Organizations: Yes  Attends Engineer, structural: More than 4 times per year    Marital Status: Never married     Family History: The patient's family history includes Alcohol abuse in his father; Colon cancer (age of onset: 16) in his mother; Hypertension in his paternal grandfather. There is no history of CAD or Prostate cancer.  ROS:   He has had some episodes of orthostatic hypotension..  EKGs/Labs/Other Studies Reviewed:        Recent Labs: 07/25/2023: ALT 25; Hemoglobin 15.3; Platelets 323 02/15/2024: TSH 2.25 03/21/2024: BUN 17; Creat 1.27; Potassium 4.4; Sodium 140  Recent Lipid Panel    Component Value Date/Time   CHOL 104 02/15/2024 1545   CHOL 105 12/12/2022 0935   TRIG 167 (H) 02/15/2024 1545   HDL 34 (L) 02/15/2024 1545   HDL 32 (L) 12/12/2022 0935   CHOLHDL 3.1 02/15/2024 1545   VLDL 23.4 05/26/2021 1103   LDLCALC 45 02/15/2024 1545    Physical Exam: There were no vitals taken for this visit.  No BP recorded.  {Refresh Note OR Click here to enter BP  :1}***    GEN:  Well nourished, well developed in no acute distress HEENT: Normal NECK: No JVD; No carotid bruits LYMPHATICS: No lymphadenopathy CARDIAC: RRR ***, no murmurs, rubs, gallops RESPIRATORY:  Clear to auscultation without rales, wheezing or rhonchi  ABDOMEN: Soft, non-tender, non-distended MUSCULOSKELETAL:  No edema; No deformity  SKIN: Warm and dry NEUROLOGIC:  Alert and oriented x 3      EKG:    ASSESSMENT:/ PLAN:      Acute on Chronic combined systolic and diastolic congestive heart failure:       2.  Hypertension:    .    3.  Hyperlipidemia :     Medication Adjustments/Labs and Tests Ordered:  Current medicines are reviewed at length with the patient today.  Concerns regarding medicines are outlined above.  No orders of the defined types were placed in this encounter.  No orders of the defined types were placed in this encounter.   Signed, Ahmad Alert, MD  04/03/2024 7:23 PM    [facility]

## 2024-04-07 ENCOUNTER — Encounter: Payer: Self-pay | Admitting: Cardiovascular Disease

## 2024-04-07 ENCOUNTER — Ambulatory Visit: Payer: PRIVATE HEALTH INSURANCE | Attending: Cardiovascular Disease | Admitting: Cardiovascular Disease

## 2024-04-07 VITALS — BP 106/70 | HR 78 | Ht 74.0 in | Wt 221.8 lb

## 2024-04-07 DIAGNOSIS — I10 Essential (primary) hypertension: Secondary | ICD-10-CM | POA: Diagnosis not present

## 2024-04-07 DIAGNOSIS — I5022 Chronic systolic (congestive) heart failure: Secondary | ICD-10-CM | POA: Diagnosis present

## 2024-04-07 DIAGNOSIS — E785 Hyperlipidemia, unspecified: Secondary | ICD-10-CM | POA: Diagnosis present

## 2024-04-07 DIAGNOSIS — E782 Mixed hyperlipidemia: Secondary | ICD-10-CM | POA: Diagnosis present

## 2024-04-07 NOTE — Patient Instructions (Signed)
 Follow-Up: At [facility], you and your health needs are our priority.  As part of our continuing mission to provide you with exceptional heart care, our providers are all part of one team.  This team includes your primary Cardiologist (physician) and Advanced Practice Providers or APPs (Physician Assistants and Nurse Practitioners) who all work together to provide you with the care you need, when you need it.  Your next appointment:   1 year(s)  Provider:   Christena Covert, MD

## 2024-05-08 ENCOUNTER — Telehealth: Payer: Self-pay | Admitting: Internal Medicine

## 2024-05-08 NOTE — Telephone Encounter (Signed)
 Copied from CRM [id number]. Topic: Medicare AWV >> May 08, 2024  9:18 AM Nathanel DEL wrote: Reason for CRM: Called LVM 05/08/2024 to schedule AWV. Please schedule Virtual or Telehealth visits ONLY.   Nathanel Paschal; Care Guide Ambulatory Clinical Support [place] l [facility] Direct Dial: [phone]

## 2024-05-08 NOTE — Telephone Encounter (Signed)
 Copied from CRM [id number]. Topic: Medicare AWV >> May 08, 2024  9:20 AM Nathanel DEL wrote: Reason for CRM: Called LVM 05/08/2024 to schedule AWV. Please schedule Virtual or Telehealth visits ONLY.   Nathanel Paschal; Care Guide Ambulatory Clinical Support [place] l [facility] Direct Dial: [phone]

## 2024-05-25 ENCOUNTER — Other Ambulatory Visit: Payer: Self-pay | Admitting: Internal Medicine

## 2024-09-02 ENCOUNTER — Telehealth: Payer: Self-pay | Admitting: Internal Medicine

## 2024-09-02 NOTE — Telephone Encounter (Signed)
 Copied from CRM [id number]. Topic: Medicare AWV >> Sep 02, 2024 11:50 AM Nathanel DEL wrote: Called LVM 09/02/2024 to schedule AWV. Please schedule in office only DUE TO [employer]  Nathanel Paschal; Care Guide Ambulatory Clinical Support [place] l [facility] Direct Dial: [phone]

## 2024-09-19 ENCOUNTER — Telehealth: Payer: Self-pay | Admitting: General Practice

## 2024-09-19 MED ORDER — SACUBITRIL-VALSARTAN 49-51 MG PO TABS: 1.0000 | ORAL_TABLET | Freq: Two times a day (BID) | ORAL | 0 refills | Status: AC

## 2024-09-19 NOTE — Telephone Encounter (Signed)
 Pt c/o medication issue:  1. Name of Medication:   ENTRESTO  49-51 MG    2. How are you currently taking this medication (dosage and times per day)? As written  3. Are you having a reaction (difficulty breathing--STAT)? no  4. What is your medication issue? Pt would like a refill sent in but he wants the generic. Please advise

## 2024-09-19 NOTE — Telephone Encounter (Signed)
 Discussed with DOD, Dr. Francyne.  Patient made aware I will send in 30 day supply while we await Dr. Ruthanne response to see if ok to send under his name.  Dr. Floretta -- this is a former [facility] pt who has not established with you yet, recall in for 03/2025.  Is it ok to send in refills under your name?

## 2024-09-22 NOTE — Addendum Note (Signed)
 Addended by: MANDA BOTTCHER B on: 09/22/2024 12:14 PM   Modules accepted: Orders

## 2024-09-22 NOTE — Telephone Encounter (Signed)
 Prescription sent in and establish appointment with Dr. Floretta was made.

## 2024-09-23 ENCOUNTER — Other Ambulatory Visit: Payer: Self-pay

## 2024-09-25 NOTE — Telephone Encounter (Signed)
 Already refilled 09/19/24

## 2024-09-26 ENCOUNTER — Other Ambulatory Visit (HOSPITAL_COMMUNITY): Payer: Self-pay

## 2024-09-30 ENCOUNTER — Telehealth: Payer: Self-pay | Admitting: Internal Medicine

## 2024-09-30 NOTE — Telephone Encounter (Signed)
 Copied from CRM #[id number]. Topic: Medicare AWV >> Sep 30, 2024  3:19 PM Nathanel DEL wrote: Called LVM 09/30/2024 to sched AWV. Please schedule AWV in office only.   Nathanel Paschal; Care Guide Ambulatory Clinical Support [place] l [facility] Direct Dial: [phone]

## 2024-10-21 ENCOUNTER — Ambulatory Visit: Payer: PRIVATE HEALTH INSURANCE | Admitting: Internal Medicine

## 2024-10-30 ENCOUNTER — Ambulatory Visit: Payer: PRIVATE HEALTH INSURANCE | Admitting: Student in an Organized Health Care Education/Training Program

## 2024-11-10 ENCOUNTER — Ambulatory Visit: Payer: PRIVATE HEALTH INSURANCE | Admitting: Student in an Organized Health Care Education/Training Program

## 2024-12-02 ENCOUNTER — Ambulatory Visit: Payer: PRIVATE HEALTH INSURANCE | Admitting: Internal Medicine

## 2024-12-08 ENCOUNTER — Ambulatory Visit: Payer: PRIVATE HEALTH INSURANCE | Admitting: Student in an Organized Health Care Education/Training Program
